# Patient Record
Sex: Female | Born: 1975
Health system: Southern US, Community
[De-identification: ages and names within clinical notes are randomized; demographics above are authoritative.]

## PROBLEM LIST (undated history)

## (undated) DIAGNOSIS — R002 Palpitations: Secondary | ICD-10-CM

## (undated) DIAGNOSIS — R000 Tachycardia, unspecified: Secondary | ICD-10-CM

## (undated) DIAGNOSIS — O00109 Unspecified tubal pregnancy without intrauterine pregnancy: Secondary | ICD-10-CM

## (undated) HISTORY — PX: ECTOPIC PREGNANCY SURGERY: SHX613

## (undated) HISTORY — PX: FOOT SURGERY: SHX648

## (undated) HISTORY — DX: Palpitations: R00.2

## (undated) HISTORY — DX: Unspecified tubal pregnancy without intrauterine pregnancy: O00.109

---

## 2002-02-04 ENCOUNTER — Emergency Department (HOSPITAL_COMMUNITY): Admission: EM | Admit: 2002-02-04 | Discharge: 2002-02-04 | Payer: Self-pay

## 2002-03-09 ENCOUNTER — Emergency Department (HOSPITAL_COMMUNITY): Admission: EM | Admit: 2002-03-09 | Discharge: 2002-03-09 | Payer: Self-pay | Admitting: Emergency Medicine

## 2002-04-21 ENCOUNTER — Emergency Department (HOSPITAL_COMMUNITY): Admission: EM | Admit: 2002-04-21 | Discharge: 2002-04-21 | Payer: Self-pay | Admitting: Emergency Medicine

## 2002-04-22 ENCOUNTER — Emergency Department (HOSPITAL_COMMUNITY): Admission: EM | Admit: 2002-04-22 | Discharge: 2002-04-22 | Payer: Self-pay | Admitting: Emergency Medicine

## 2002-07-23 ENCOUNTER — Emergency Department (HOSPITAL_COMMUNITY): Admission: EM | Admit: 2002-07-23 | Discharge: 2002-07-23 | Payer: Self-pay | Admitting: Emergency Medicine

## 2002-11-11 ENCOUNTER — Emergency Department (HOSPITAL_COMMUNITY): Admission: EM | Admit: 2002-11-11 | Discharge: 2002-11-11 | Payer: Self-pay | Admitting: Emergency Medicine

## 2002-11-13 ENCOUNTER — Ambulatory Visit (HOSPITAL_COMMUNITY): Admission: RE | Admit: 2002-11-13 | Discharge: 2002-11-13 | Payer: Self-pay | Admitting: Emergency Medicine

## 2002-11-13 ENCOUNTER — Encounter: Payer: Self-pay | Admitting: Emergency Medicine

## 2003-10-02 ENCOUNTER — Emergency Department (HOSPITAL_COMMUNITY): Admission: EM | Admit: 2003-10-02 | Discharge: 2003-10-02 | Payer: Self-pay | Admitting: Emergency Medicine

## 2004-02-10 ENCOUNTER — Other Ambulatory Visit: Admission: RE | Admit: 2004-02-10 | Discharge: 2004-02-10 | Payer: Self-pay | Admitting: Obstetrics and Gynecology

## 2005-03-12 ENCOUNTER — Emergency Department (HOSPITAL_COMMUNITY): Admission: EM | Admit: 2005-03-12 | Discharge: 2005-03-13 | Payer: Self-pay | Admitting: Emergency Medicine

## 2005-11-13 ENCOUNTER — Emergency Department (HOSPITAL_COMMUNITY): Admission: EM | Admit: 2005-11-13 | Discharge: 2005-11-14 | Payer: Self-pay | Admitting: Emergency Medicine

## 2008-07-07 ENCOUNTER — Emergency Department (HOSPITAL_BASED_OUTPATIENT_CLINIC_OR_DEPARTMENT_OTHER): Admission: EM | Admit: 2008-07-07 | Discharge: 2008-07-07 | Payer: Self-pay | Admitting: Emergency Medicine

## 2010-05-20 ENCOUNTER — Emergency Department (HOSPITAL_BASED_OUTPATIENT_CLINIC_OR_DEPARTMENT_OTHER): Admission: EM | Admit: 2010-05-20 | Discharge: 2010-05-20 | Payer: Self-pay | Admitting: Emergency Medicine

## 2010-08-05 ENCOUNTER — Emergency Department (HOSPITAL_BASED_OUTPATIENT_CLINIC_OR_DEPARTMENT_OTHER): Admission: EM | Admit: 2010-08-05 | Discharge: 2010-08-05 | Payer: Self-pay | Admitting: Emergency Medicine

## 2011-06-11 ENCOUNTER — Encounter: Payer: Self-pay | Admitting: Emergency Medicine

## 2011-06-11 ENCOUNTER — Emergency Department (INDEPENDENT_AMBULATORY_CARE_PROVIDER_SITE_OTHER): Payer: Self-pay

## 2011-06-11 ENCOUNTER — Emergency Department (HOSPITAL_BASED_OUTPATIENT_CLINIC_OR_DEPARTMENT_OTHER)
Admission: EM | Admit: 2011-06-11 | Discharge: 2011-06-11 | Disposition: A | Discharge status: Home / Self Care | Payer: Self-pay | Attending: Emergency Medicine | Admitting: Emergency Medicine

## 2011-06-11 DIAGNOSIS — S61209A Unspecified open wound of unspecified finger without damage to nail, initial encounter: Secondary | ICD-10-CM | POA: Insufficient documentation

## 2011-06-11 DIAGNOSIS — S61409A Unspecified open wound of unspecified hand, initial encounter: Secondary | ICD-10-CM

## 2011-06-11 DIAGNOSIS — W268XXA Contact with other sharp object(s), not elsewhere classified, initial encounter: Secondary | ICD-10-CM

## 2011-06-11 DIAGNOSIS — Y92009 Unspecified place in unspecified non-institutional (private) residence as the place of occurrence of the external cause: Secondary | ICD-10-CM | POA: Insufficient documentation

## 2011-06-11 DIAGNOSIS — X838XXA Intentional self-harm by other specified means, initial encounter: Secondary | ICD-10-CM | POA: Insufficient documentation

## 2011-06-11 DIAGNOSIS — S61219A Laceration without foreign body of unspecified finger without damage to nail, initial encounter: Secondary | ICD-10-CM

## 2011-06-11 MED ORDER — HYDROCODONE-ACETAMINOPHEN 5-325 MG PO TABS: 2.0000 | ORAL_TABLET | ORAL | Status: AC | PRN

## 2011-06-11 MED ORDER — LIDOCAINE HCL 2 % IJ SOLN
INTRAMUSCULAR | Status: DC
Start: 2011-06-11 — End: 2011-06-11
  Filled 2011-06-11: qty 1

## 2011-06-11 NOTE — ED Notes (Addendum)
Suture cart placed in the room. Wet gauze has been placed on the patient's right hand.

## 2011-06-11 NOTE — ED Notes (Signed)
Pt c/o RT hand pain s/p punching a bathroom mirror this am; multiple jagged lacerations present, minimal active bleeding

## 2011-06-11 NOTE — ED Provider Notes (Signed)
History/physical exam/procedure(s) were performed by non-physician practitioner and as supervising physician I was immediately available for consultation/collaboration. I have reviewed all notes and am in agreement with care and plan.   Hilario Quarry, MD 06/11/11 1630

## 2011-06-11 NOTE — ED Provider Notes (Signed)
History     CSN: [id number] Arrival date & time: 06/11/2011 11:56 AM   Chief Complaint  Patient presents with  . Hand Injury     (Include location/radiation/quality/duration/timing/severity/associated sxs/prior treatment) Patient is a 35 y.o. female presenting with hand injury.  Hand Injury  The incident occurred 3 to 5 hours ago. The incident occurred at home. There was no injury mechanism. The pain is present in the right hand. The quality of the pain is described as aching. The pain is at a severity of 5/10. The pain is moderate. The pain has been constant since the incident. Pertinent negatives include no fever. Possible foreign bodies include glass. The symptoms are aggravated by movement. She has tried nothing for the symptoms. The treatment provided moderate relief.  Pt hit a mirror with right hand.  Pt complains of lacerrations to hand.   History reviewed. No pertinent past medical history.   History reviewed. No pertinent past surgical history.  No family history on file.  History  Substance Use Topics  . Smoking status: Never Smoker   . Smokeless tobacco: Not on file  . Alcohol Use: Yes     occ    OB History    Grav Para Term Preterm Abortions TAB SAB Ect Mult Living                  Review of Systems  Constitutional: Negative for fever.  Skin: Positive for wound.  All other systems reviewed and are negative.    Allergies  Review of patient's allergies indicates no known allergies.  Home Medications  No current outpatient prescriptions on file.  Physical Exam    BP 132/80  Pulse 63  Temp(Src) 98.2 F (36.8 C) (Oral)  Resp 18  SpO2 100%  LMP 06/06/2011  Physical Exam  Nursing note and vitals reviewed. Constitutional: She appears well-developed.  HENT:  Head: Normocephalic.  Musculoskeletal: She exhibits tenderness.       Laceration right hand, base of 5th finger,  From,  nv and ns intact.  Abrasions to 2nd and 3rd finger,  Abrasion mid  hand,  1cm gapping area   Neurological: She is alert.  Skin: Skin is warm and dry. There is erythema.  Psychiatric: She has a normal mood and affect.    ED Course  LACERATION REPAIR Date/Time: 06/11/2011 2:37 PM Performed by: Langston Masker Authorized by: Hilario Quarry Consent: Verbal consent obtained. Consent given by: patient Patient understanding: patient states understanding of the procedure being performed Patient identity confirmed: verbally with patient Time out: Immediately prior to procedure a "time out" was called to verify the correct patient, procedure, equipment, support staff and site/side marked as required. Body area: upper extremity Location details: right ring finger Laceration length: 1 cm Foreign bodies: glass Tendon involvement: none Nerve involvement: none Vascular damage: no Anesthesia: local infiltration Patient sedated: no Irrigation solution: saline Skin closure: 5-0 Prolene Number of sutures: 3 Technique: simple Approximation: close Approximation difficulty: simple Dressing: 4x4 sterile gauze Patient tolerance: Patient tolerated the procedure well with no immediate complications.    No results found for this or any previous visit. Dg Hand Complete Right  06/11/2011  *RADIOLOGY REPORT*  Clinical Data: Punched a mirror with right hand.  Lacerations.  RIGHT HAND - COMPLETE 3+ VIEW  Comparison: None  Findings: No evidence for acute fracture or subluxation.  There is soft tissue defect at the web space between the fourth and fifth digits.  No evidence for radiopaque foreign body.  IMPRESSION:  1. Visualized osseous structures have a normal appearance. 2.  No radiopaque foreign body. 3.  Laceration.  Original Report Authenticated By: Patterson Hammersmith, M.D.     No diagnosis found.   MDM Pt advised suture removal in 8 days. No results found for this or any previous visit. Dg Hand Complete Right  06/11/2011  *RADIOLOGY REPORT*  Clinical Data:  Punched a mirror with right hand.  Lacerations.  RIGHT HAND - COMPLETE 3+ VIEW  Comparison: None  Findings: No evidence for acute fracture or subluxation.  There is soft tissue defect at the web space between the fourth and fifth digits.  No evidence for radiopaque foreign body.  IMPRESSION:  1. Visualized osseous structures have a normal appearance. 2.  No radiopaque foreign body. 3.  Laceration.  Original Report Authenticated By: Patterson Hammersmith, M.D.         Langston Masker, [place] 06/11/11 1439

## 2011-06-28 LAB — RPR: RPR Ser Ql: NONREACTIVE

## 2011-06-28 LAB — URINE MICROSCOPIC-ADD ON

## 2011-06-28 LAB — DIFFERENTIAL
Basophils Absolute: 0
Basophils Relative: 0
Lymphocytes Relative: 37
Monocytes Absolute: 0.4
Neutro Abs: 3.1
Neutrophils Relative %: 55

## 2011-06-28 LAB — GC/CHLAMYDIA PROBE AMP, GENITAL: GC Probe Amp, Genital: NEGATIVE

## 2011-06-28 LAB — URINALYSIS, ROUTINE W REFLEX MICROSCOPIC
Glucose, UA: NEGATIVE
Ketones, ur: NEGATIVE
Leukocytes, UA: NEGATIVE
Nitrite: NEGATIVE
Specific Gravity, Urine: 1.026
pH: 5.5

## 2011-06-28 LAB — CBC
MCHC: 32.8
Platelets: 185
RDW: 12.5

## 2011-06-28 LAB — WET PREP, GENITAL: Yeast Wet Prep HPF POC: NONE SEEN

## 2011-06-28 LAB — PREGNANCY, URINE: Preg Test, Ur: NEGATIVE

## 2012-06-04 ENCOUNTER — Encounter (HOSPITAL_BASED_OUTPATIENT_CLINIC_OR_DEPARTMENT_OTHER): Payer: Self-pay | Admitting: *Deleted

## 2012-06-04 ENCOUNTER — Emergency Department (HOSPITAL_BASED_OUTPATIENT_CLINIC_OR_DEPARTMENT_OTHER)
Admission: EM | Admit: 2012-06-04 | Discharge: 2012-06-04 | Disposition: A | Discharge status: Home / Self Care | Payer: Self-pay | Attending: Emergency Medicine | Admitting: Emergency Medicine

## 2012-06-04 DIAGNOSIS — R002 Palpitations: Secondary | ICD-10-CM | POA: Insufficient documentation

## 2012-06-04 LAB — CBC WITH DIFFERENTIAL/PLATELET
Basophils Relative: 0 % (ref 0–1)
Eosinophils Relative: 1 % (ref 0–5)
HCT: 33.6 % — ABNORMAL LOW (ref 36.0–46.0)
Hemoglobin: 11.4 g/dL — ABNORMAL LOW (ref 12.0–15.0)
Lymphocytes Relative: 47 % — ABNORMAL HIGH (ref 12–46)
MCHC: 33.9 g/dL (ref 30.0–36.0)
MCV: 85.9 fL (ref 78.0–100.0)
Monocytes Absolute: 0.3 10*3/uL (ref 0.1–1.0)
Monocytes Relative: 9 % (ref 3–12)
Neutro Abs: 1.7 10*3/uL (ref 1.7–7.7)

## 2012-06-04 LAB — BASIC METABOLIC PANEL
BUN: 15 mg/dL (ref 6–23)
CO2: 29 mEq/L (ref 19–32)
Calcium: 9.5 mg/dL (ref 8.4–10.5)
Chloride: 102 mEq/L (ref 96–112)
Creatinine, Ser: 1 mg/dL (ref 0.50–1.10)

## 2012-06-04 NOTE — ED Provider Notes (Signed)
History     CSN: [id number]  Arrival date & time 06/04/12  1356   First MD Initiated Contact with Patient 06/04/12 1432      Chief Complaint  Patient presents with  . Palpitations    (Consider location/radiation/quality/duration/timing/severity/associated sxs/prior treatment) HPI  Patient complaining of palpitations like her heart is racing for 2 months. She states that she has had some increase in her exercise. She denies any chest pain or shortness of breath. She has not had any change in weight, sleep, or eating pattern. She has no history of coronary artery disease or problems with her thyroid.  History reviewed. No pertinent past medical history.  History reviewed. No pertinent past surgical history.  History reviewed. No pertinent family history.  History  Substance Use Topics  . Smoking status: Never Smoker   . Smokeless tobacco: Not on file  . Alcohol Use: No     occ    OB History    Grav Para Term Preterm Abortions TAB SAB Ect Mult Living                  Review of Systems  Constitutional: Negative for fever, chills, activity change, appetite change and unexpected weight change.  HENT: Negative for sore throat, rhinorrhea, neck pain, neck stiffness and sinus pressure.   Eyes: Negative for visual disturbance.  Respiratory: Negative for cough and shortness of breath.   Cardiovascular: Negative for chest pain and leg swelling.  Gastrointestinal: Negative for vomiting, abdominal pain, diarrhea and blood in stool.  Genitourinary: Negative for dysuria, urgency, frequency, vaginal discharge and difficulty urinating.  Musculoskeletal: Negative for myalgias, arthralgias and gait problem.  Skin: Negative for color change and rash.  Neurological: Negative for weakness, light-headedness and headaches.  Hematological: Does not bruise/bleed easily.  Psychiatric/Behavioral: Negative for dysphoric mood.    Allergies  Review of patient's allergies indicates no known  allergies.  Home Medications  No current outpatient prescriptions on file.  BP 116/67  Pulse 61  Temp 98.5 F (36.9 C)  Resp 16  Ht 5\' 4"  (1.626 m)  Wt 151 lb (68.493 kg)  BMI 25.92 kg/m2  SpO2 100%  LMP 05/11/2012  Physical Exam  Nursing note and vitals reviewed. Constitutional: She appears well-developed and well-nourished.  HENT:  Head: Normocephalic and atraumatic.  Eyes: Conjunctivae and EOM are normal. Pupils are equal, round, and reactive to light.  Neck: Normal range of motion. Neck supple.  Cardiovascular: Normal rate, regular rhythm, normal heart sounds and intact distal pulses.   Pulmonary/Chest: Effort normal and breath sounds normal.  Abdominal: Soft. Bowel sounds are normal.  Musculoskeletal: Normal range of motion.  Neurological: She is alert.  Skin: Skin is warm and dry.  Psychiatric: She has a normal mood and affect. Thought content normal.    ED Course  Procedures (including critical care time)  Labs Reviewed  CBC WITH DIFFERENTIAL - Abnormal; Notable for the following:    WBC 3.9 (*)     Hemoglobin 11.4 (*)     HCT 33.6 (*)     Lymphocytes Relative 47 (*)     All other components within normal limits  BASIC METABOLIC PANEL   No results found.   No diagnosis found.   Date: 06/04/2012  Rate: 59  Rhythm: normal sinus rhythm  QRS Axis: normal  Intervals: normal  ST/T Wave abnormalities: early repolarization  Conduction Disutrbances:none  Narrative Interpretation:   Old EKG Reviewed: unchanged   . MDM  Patient with ekg unchanged from prior .  Patient advised close follow up with pmd for thyroid studies and close follow up.     I personally performed the services described in this documentation, which was scribed in my presence. The recorded information has been reviewed and considered.   Hilario Quarry, MD 06/06/12 [phone]

## 2012-06-04 NOTE — ED Notes (Signed)
Pt c/o "heart racing " x 1 week on and off

## 2012-07-14 ENCOUNTER — Emergency Department (HOSPITAL_BASED_OUTPATIENT_CLINIC_OR_DEPARTMENT_OTHER)
Admission: EM | Admit: 2012-07-14 | Discharge: 2012-07-14 | Disposition: A | Discharge status: Home / Self Care | Payer: Self-pay | Attending: Emergency Medicine | Admitting: Emergency Medicine

## 2012-07-14 ENCOUNTER — Emergency Department (HOSPITAL_BASED_OUTPATIENT_CLINIC_OR_DEPARTMENT_OTHER): Payer: Self-pay

## 2012-07-14 ENCOUNTER — Encounter (HOSPITAL_BASED_OUTPATIENT_CLINIC_OR_DEPARTMENT_OTHER): Payer: Self-pay | Admitting: *Deleted

## 2012-07-14 DIAGNOSIS — X58XXXA Exposure to other specified factors, initial encounter: Secondary | ICD-10-CM | POA: Insufficient documentation

## 2012-07-14 DIAGNOSIS — S93609A Unspecified sprain of unspecified foot, initial encounter: Secondary | ICD-10-CM

## 2012-07-14 DIAGNOSIS — M722 Plantar fascial fibromatosis: Secondary | ICD-10-CM

## 2012-07-14 DIAGNOSIS — S93699A Other sprain of unspecified foot, initial encounter: Secondary | ICD-10-CM | POA: Insufficient documentation

## 2012-07-14 HISTORY — DX: Tachycardia, unspecified: R00.0

## 2012-07-14 MED ORDER — IBUPROFEN 800 MG PO TABS: 800.0000 mg | ORAL_TABLET | Freq: Three times a day (TID) | ORAL | Status: DC

## 2012-07-14 MED ORDER — HYDROCODONE-ACETAMINOPHEN 5-325 MG PO TABS: ORAL_TABLET | ORAL | Status: DC

## 2012-07-14 NOTE — ED Notes (Signed)
Patient c/o L foot pain for the past two weeks, hurts to ambulate, when elevated pain not as bad, but continues to throb. Took OTC meds but no relief, no injury. Walks on treadmill to exercise

## 2012-07-14 NOTE — Discharge Instructions (Signed)
 Plantar Fasciitis Plantar fasciitis is a common condition that causes foot pain. It is soreness (inflammation) of the band of tough fibrous tissue on the bottom of the foot that runs from the heel bone (calcaneus) to the ball of the foot. The cause of this soreness may be from excessive standing, poor fitting shoes, running on hard surfaces, being overweight, having an abnormal walk, or overuse (this is common in runners) of the painful foot or feet. It is also common in aerobic exercise dancers and ballet dancers. SYMPTOMS  Most people with plantar fasciitis complain of:  Severe pain in the morning on the bottom of their foot especially when taking the first steps out of bed. This pain recedes after a few minutes of walking.  Severe pain is experienced also during walking following a long period of inactivity.  Pain is worse when walking barefoot or up stairs DIAGNOSIS   Your caregiver will diagnose this condition by examining and feeling your foot.  Special tests such as X-rays of your foot, are usually not needed. PREVENTION   Consult a sports medicine professional before beginning a new exercise program.  Walking programs offer a good workout. With walking there is a lower chance of overuse injuries common to runners. There is less impact and less jarring of the joints.  Begin all new exercise programs slowly. If problems or pain develop, decrease the amount of time or distance until you are at a comfortable level.  Wear good shoes and replace them regularly.  Stretch your foot and the heel cords at the back of the ankle (Achilles tendon) both before and after exercise.  Run or exercise on even surfaces that are not hard. For example, asphalt is better than pavement.  Do not run barefoot on hard surfaces.  If using a treadmill, vary the incline.  Do not continue to workout if you have foot or joint problems. Seek professional help if they do not improve. HOME CARE INSTRUCTIONS     Avoid activities that cause you pain until you recover.  Use ice or cold packs on the problem or painful areas after working out.  Only take over-the-counter or prescription medicines for pain, discomfort, or fever as directed by your caregiver.  Soft shoe inserts or athletic shoes with air or gel sole cushions may be helpful.  If problems continue or become more severe, consult a sports medicine caregiver or your own health care provider. Cortisone is a potent anti-inflammatory medication that may be injected into the painful area. You can discuss this treatment with your caregiver. MAKE SURE YOU:   Understand these instructions.  Will watch your condition.  Will get help right away if you are not doing well or get worse. Document Released: 06/07/2001 Document Revised: 12/05/2011 Document Reviewed: 08/06/2008 [facility] Patient Information 2013 [place], [place].    Foot Sprain The muscles and cord like structures which attach muscle to bone (tendons) that surround the feet are made up of units. A foot sprain can occur at the weakest spot in any of these units. This condition is most often caused by injury to or overuse of the foot, as from playing contact sports, or aggravating a previous injury, or from poor conditioning, or obesity. SYMPTOMS  Pain with movement of the foot.  Tenderness and swelling at the injury site.  Loss of strength is present in moderate or severe sprains. THE THREE GRADES OR SEVERITY OF FOOT SPRAIN ARE:  Mild (Grade I): Slightly pulled muscle without tearing of muscle or  tendon fibers or loss of strength.  Moderate (Grade II): Tearing of fibers in a muscle, tendon, or at the attachment to bone, with small decrease in strength.  Severe (Grade III): Rupture of the muscle-tendon-bone attachment, with separation of fibers. Severe sprain requires surgical repair. Often repeating (chronic) sprains are caused by overuse. Sudden (acute) sprains are caused by  direct injury or over-use. DIAGNOSIS  Diagnosis of this condition is usually by your own observation. If problems continue, a caregiver may be required for further evaluation and treatment. X-rays may be required to make sure there are not breaks in the bones (fractures) present. Continued problems may require physical therapy for treatment. PREVENTION  Use strength and conditioning exercises appropriate for your sport.  Warm up properly prior to working out.  Use athletic shoes that are made for the sport you are participating in.  Allow adequate time for healing. Early return to activities makes repeat injury more likely, and can lead to an unstable arthritic foot that can result in prolonged disability. Mild sprains generally heal in 3 to 10 days, with moderate and severe sprains taking 2 to 10 weeks. Your caregiver can help you determine the proper time required for healing. HOME CARE INSTRUCTIONS   Apply ice to the injury for 15 to 20 minutes, 3 to 4 times per day. Put the ice in a plastic bag and place a towel between the bag of ice and your skin.  An elastic wrap (like an Ace bandage) may be used to keep swelling down.  Keep foot above the level of the heart, or at least raised on a footstool, when swelling and pain are present.  Try to avoid use other than gentle range of motion while the foot is painful. Do not resume use until instructed by your caregiver. Then begin use gradually, not increasing use to the point of pain. If pain does develop, decrease use and continue the above measures, gradually increasing activities that do not cause discomfort, until you gradually achieve normal use.  Use crutches if and as instructed, and for the length of time instructed.  Keep injured foot and ankle wrapped between treatments.  Massage foot and ankle for comfort and to keep swelling down. Massage from the toes up towards the knee.  Only take over-the-counter or prescription medicines for  pain, discomfort, or fever as directed by your caregiver. SEEK IMMEDIATE MEDICAL CARE IF:   Your pain and swelling increase, or pain is not controlled with medications.  You have loss of feeling in your foot or your foot turns cold or blue.  You develop new, unexplained symptoms, or an increase of the symptoms that brought you to your caregiver. MAKE SURE YOU:   Understand these instructions.  Will watch your condition.  Will get help right away if you are not doing well or get worse. Document Released: 03/04/2002 Document Revised: 12/05/2011 Document Reviewed: 05/01/2008 [facility] Patient Information 2013 [place], [place].

## 2012-07-14 NOTE — ED Provider Notes (Addendum)
History     CSN: [id number]  Arrival date & time 07/14/12  2130   First MD Initiated Contact with Patient 07/14/12 [phone]      Chief Complaint  Patient presents with  . Foot Pain    (Consider location/radiation/quality/duration/timing/severity/associated sxs/prior treatment) HPI  Past Medical History  Diagnosis Date  . Racing heart beat     avoids caffine/chocolate    Past Surgical History  Procedure Date  . Foot surgery      l foot     History reviewed. No pertinent family history.  History  Substance Use Topics  . Smoking status: Never Smoker   . Smokeless tobacco: Not on file  . Alcohol Use: Yes    OB History    Grav Para Term Preterm Abortions TAB SAB Ect Mult Living                  Review of Systems  Constitutional: Negative for fever and chills.  Musculoskeletal: Positive for arthralgias. Negative for joint swelling.  Skin: Negative for color change, rash and wound.  Neurological: Positive for dizziness. Negative for seizures, speech difficulty, weakness, light-headedness, numbness and headaches.    Allergies  Vicodin  Home Medications   Current Outpatient Rx  Name Route Sig Dispense Refill  . VITAMIN B12 PO Oral Take by mouth.    . MULTI-VITAMIN/MINERALS PO TABS Oral Take 1 tablet by mouth daily.    Marland Kitchen HYDROCODONE-ACETAMINOPHEN 5-325 MG PO TABS  1-2 tablets po q 6 hours prn moderate to severe pain 20 tablet 0  . IBUPROFEN 800 MG PO TABS Oral Take 1 tablet (800 mg total) by mouth 3 (three) times daily. 21 tablet 0    BP 100/47  Pulse 54  Temp 99.1 F (37.3 C) (Oral)  Resp 18  SpO2 100%  LMP 07/14/2012  Physical Exam  Nursing note and vitals reviewed. Constitutional: She appears well-developed and well-nourished.  HENT:  Head: Normocephalic and atraumatic.  Cardiovascular: Normal rate.   Pulmonary/Chest: Effort normal. No respiratory distress.  Musculoskeletal:       Left foot: She exhibits tenderness. She exhibits normal range of  motion, no swelling, normal capillary refill, no deformity and no laceration.       Tender to sole of foot.  No sig change with toe flexion or extension.  No rash, discoloration.  No obv deformity to foot. On left.   Neurological: She is alert. She has normal strength. No sensory deficit. She exhibits normal muscle tone. GCS eye subscore is 4. GCS verbal subscore is 5. GCS motor subscore is 6.    ED Course  Procedures (including critical care time)  Labs Reviewed - No data to display Dg Foot 2 Views Left  07/14/2012  *RADIOLOGY REPORT*  Clinical Data:  LEFT FOOT - 2 VIEW  Comparison: 07/14/2012  Findings: A frontal projection of the foot with weightbearing was performed.  A lateral view of the foot with weightbearing is not provided.  No malalignment at the Lisfranc joint on this frontal projection. No fracture observed.  Possible proximal interphalangeal joint fusion of the second, third, and fourth digit phalanges.  IMPRESSION:  1.  Frontal projection of the foot with weightbearing demonstrates no Lisfranc joint instability.  No acute bony findings.  If symptoms persist despite conservative therapy, MRI followup may be warranted.   Original Report Authenticated By: Dellia Cloud, M.D.    Dg Foot Complete Left  07/14/2012  *RADIOLOGY REPORT*  Clinical Data:  left foot pain  LEFT  FOOT - COMPLETE 3+ VIEW  Comparison: None.  Findings: Three views of the left foot submitted.  No acute fracture or subluxation.  No radiopaque foreign body.  There is bony fusion proximal with mid phalanx second and third toe.  IMPRESSION: No acute fracture or subluxation.   Original Report Authenticated By: Natasha Mead, M.D.    I reviewed films myself and agree with radiologist interpretation  1. Foot sprain   2. Plantar fasciitis     1:46 PM I inquired with radiology staff, will want a standing foot film as well.    MDM  Pt likely with foot sprain.  Will get standing foot film to r/o lisfranc's injury.   Also consider plantar fasciitis.          Gavin Pound. Oletta Lamas, MD 07/14/12 2147  Gavin Pound. Oletta Lamas, MD 07/14/12 2150

## 2012-08-20 ENCOUNTER — Ambulatory Visit (INDEPENDENT_AMBULATORY_CARE_PROVIDER_SITE_OTHER): Payer: No Typology Code available for payment source | Admitting: Cardiology

## 2012-08-20 ENCOUNTER — Encounter: Payer: Self-pay | Admitting: Cardiology

## 2012-08-20 VITALS — BP 120/80 | HR 69 | Ht 64.0 in | Wt 152.0 lb

## 2012-08-20 DIAGNOSIS — R002 Palpitations: Secondary | ICD-10-CM

## 2012-08-20 NOTE — Progress Notes (Signed)
HPI The patient presents for evaluation of palpitations. She has been having this for several weeks. She said she feels her heart skipping. She will feel a skipped beat followed by a strong beat. She might get a tingling or sweaty sensation. On one occasion this happened and she felt presyncopal. She presented to the hospital in [place].  I was able to review these records. She did have premature ectopic complexes with a right bundle branch pattern. She had a normal TSH. She had a slightly elevated CK but normal MB and troponin. Her potassium was slightly low. She says she cut out caffeine following this and had some improvement in symptoms. However, she recently took a supplement med for exercise. She thought that it was without caffeine. Her symptoms worsen. She says she feels the skipping heartbeats when she is resting or slowing down from exercise. She cannot bring it on with exercise. She might get some mild chest tightness. She denies any overt shortness of breath, PND or orthopnea. She's had no weight gain or edema. She has been quite anxious about this. She does exercise routinely. This has been going well.  No Known Allergies  Current Outpatient Prescriptions  Medication Sig Dispense Refill  . Ascorbic Acid (VITAMIN C) 500 MG CHEW Chew by mouth.      . Prenatal Vit-Fe Fumarate-FA (PRENATAL VITAMINS PLUS) 27-1 MG TABS Take by mouth.      Marland Kitchen VITAMIN B1-B12 PO Take 500 Units by mouth.        Past Medical History  Diagnosis Date  . Ectopic pregnancy, tubal   . Palpitation     Past Surgical History  Procedure Date  . Foot surgery     Left  . Ectopic pregnancy surgery     2    Family History  Problem Relation Age of Onset  . CAD Father 30  . Heart disease Mother 69    Palpitations and murmur    History   Social History  . Marital Status: Single    Spouse Name: N/A    Number of Children: 0  . Years of Education: N/A   Occupational History  . Student and works in  childcare    Social History Main Topics  . Smoking status: Former Smoker    Types: Cigarettes  . Smokeless tobacco: Not on file     Comment: Quit tobacco 04  . Alcohol Use: No     Comment: occ  . Drug Use: No  . Sexually Active: Yes    Birth Control/ Protection: None   Other Topics Concern  . Not on file   Social History Narrative   Lives with boyfriend.    Exercises regularly    ROS:  As stated in the HPI and negative for all other systems.  PHYSICAL EXAM BP 120/80  Pulse 69  Ht 5\' 4"  (1.626 m)  Wt 152 lb (68.947 kg)  BMI 26.09 kg/m2  SpO2 98% GENERAL:  Well appearing HEENT:  Pupils equal round and reactive, fundi not visualized, oral mucosa unremarkable NECK:  No jugular venous distention, waveform within normal limits, carotid upstroke brisk and symmetric, no bruits, no thyromegaly LYMPHATICS:  No cervical, inguinal adenopathy LUNGS:  Clear to auscultation bilaterally BACK:  No CVA tenderness CHEST:  Unremarkable HEART:  PMI not displaced or sustained,S1 and S2 within normal limits, no S3, no S4, no clicks, no rubs, no murmurs ABD:  Flat, positive bowel sounds normal in frequency in pitch, no bruits, no rebound, no guarding,  no midline pulsatile mass, no hepatomegaly, no splenomegaly EXT:  2 plus pulses throughout, no edema, no cyanosis no clubbing SKIN:  No rashes no nodules NEURO:  Cranial nerves II through XII grossly intact, motor grossly intact throughout [facility]:  Cognitively intact, oriented to person place and time   EKG:  Normal sinus rhythm, rate 59, early repolarization pattern, no acute ST-T wave changes 08/20/2012  ASSESSMENT AND PLAN  Palpitations - The patient does have demonstrated premature ectopic complexes. These are quite symptomatic to her. I'm going to start with a 48 hour Holter, echocardiogram and an exercise treadmill test. Likely she will need to be treated symptomatically for these following this. We had a long discussion about this. He

## 2012-08-20 NOTE — Patient Instructions (Addendum)
The current medical regimen is effective;  continue present plan and medications.  Your physician has requested that you have an echocardiogram. Echocardiography is a painless test that uses sound waves to create images of your heart. It provides your doctor with information about the size and shape of your heart and how well your heart's chambers and valves are working. This procedure takes approximately one hour. There are no restrictions for this procedure.  Your physician has recommended that you wear a holter monitor. Holter monitors are medical devices that record the heart's electrical activity. Doctors most often use these monitors to diagnose arrhythmias. Arrhythmias are problems with the speed or rhythm of the heartbeat. The monitor is a small, portable device. You can wear one while you do your normal daily activities. This is usually used to diagnose what is causing palpitations/syncope (passing out).  Your physician has requested that you have an exercise tolerance test. For further information please visit [email or web address] Please also follow instruction sheet, as given.  Please sign up for My Chart.  This will allow you to view your records at your convenience.  Follow up will be based on results.

## 2012-08-21 DIAGNOSIS — R002 Palpitations: Secondary | ICD-10-CM | POA: Insufficient documentation

## 2012-08-28 ENCOUNTER — Ambulatory Visit (HOSPITAL_COMMUNITY): Payer: No Typology Code available for payment source | Attending: Cardiovascular Disease | Admitting: Radiology

## 2012-08-28 ENCOUNTER — Encounter (INDEPENDENT_AMBULATORY_CARE_PROVIDER_SITE_OTHER): Payer: No Typology Code available for payment source

## 2012-08-28 DIAGNOSIS — R079 Chest pain, unspecified: Secondary | ICD-10-CM | POA: Insufficient documentation

## 2012-08-28 DIAGNOSIS — R0989 Other specified symptoms and signs involving the circulatory and respiratory systems: Secondary | ICD-10-CM | POA: Insufficient documentation

## 2012-08-28 DIAGNOSIS — Z87891 Personal history of nicotine dependence: Secondary | ICD-10-CM | POA: Insufficient documentation

## 2012-08-28 DIAGNOSIS — R0609 Other forms of dyspnea: Secondary | ICD-10-CM | POA: Insufficient documentation

## 2012-08-28 DIAGNOSIS — Z8249 Family history of ischemic heart disease and other diseases of the circulatory system: Secondary | ICD-10-CM | POA: Insufficient documentation

## 2012-08-28 DIAGNOSIS — R002 Palpitations: Secondary | ICD-10-CM

## 2012-08-28 NOTE — Progress Notes (Signed)
Echocardiogram performed.  

## 2012-08-30 ENCOUNTER — Telehealth: Payer: Self-pay | Admitting: Cardiology

## 2012-08-30 NOTE — Telephone Encounter (Signed)
Spoke with pt, aware of echo results. 

## 2012-08-30 NOTE — Telephone Encounter (Signed)
Pt would like results of her echo

## 2012-09-04 ENCOUNTER — Encounter: Payer: Self-pay | Admitting: Physician Assistant

## 2012-10-03 ENCOUNTER — Encounter: Payer: Self-pay | Admitting: Physician Assistant

## 2013-01-10 ENCOUNTER — Encounter: Payer: Self-pay | Admitting: Physician Assistant

## 2013-08-15 ENCOUNTER — Emergency Department (HOSPITAL_BASED_OUTPATIENT_CLINIC_OR_DEPARTMENT_OTHER)
Admission: EM | Admit: 2013-08-15 | Discharge: 2013-08-15 | Disposition: A | Discharge status: Home / Self Care | Payer: No Typology Code available for payment source | Attending: Emergency Medicine | Admitting: Emergency Medicine

## 2013-08-15 ENCOUNTER — Encounter (HOSPITAL_BASED_OUTPATIENT_CLINIC_OR_DEPARTMENT_OTHER): Payer: Self-pay | Admitting: Emergency Medicine

## 2013-08-15 DIAGNOSIS — Z87891 Personal history of nicotine dependence: Secondary | ICD-10-CM | POA: Insufficient documentation

## 2013-08-15 DIAGNOSIS — N643 Galactorrhea not associated with childbirth: Secondary | ICD-10-CM | POA: Insufficient documentation

## 2013-08-15 DIAGNOSIS — Z8742 Personal history of other diseases of the female genital tract: Secondary | ICD-10-CM | POA: Insufficient documentation

## 2013-08-15 DIAGNOSIS — Z8679 Personal history of other diseases of the circulatory system: Secondary | ICD-10-CM | POA: Insufficient documentation

## 2013-08-15 DIAGNOSIS — Z79899 Other long term (current) drug therapy: Secondary | ICD-10-CM | POA: Insufficient documentation

## 2013-08-15 NOTE — ED Notes (Signed)
Pt. Reports she has been pregnant x two with ectopic pregnancies.   Pt. Reports she is married and sexually active.  Pt. Reports she does not use BC.

## 2013-08-15 NOTE — ED Notes (Signed)
Pt c/o left breast leaking. Pt reports h/o same within previous 4 months. Pt denies pregnancy. Pt denies pain.

## 2013-08-15 NOTE — ED Provider Notes (Signed)
CSN: [id number]     Arrival date & time 08/15/13  1440 History   First MD Initiated Contact with Patient 08/15/13 1447     Chief Complaint  Patient presents with  . Breast Discharge   (Consider location/radiation/quality/duration/timing/severity/associated sxs/prior Treatment) HPI Comments: Pt states that she noticed that her right breast was leaking this morning:pt states that similar symptoms happened about 3 months ago:was not seen:pt denies any medical problems:denies pain  The history is provided by the patient. No language interpreter was used.    Past Medical History  Diagnosis Date  . Ectopic pregnancy, tubal   . Palpitation    Past Surgical History  Procedure Laterality Date  . Foot surgery      Left  . Ectopic pregnancy surgery      2   Family History  Problem Relation Age of Onset  . CAD Father 48  . Heart disease Mother 49    Palpitations and murmur   History  Substance Use Topics  . Smoking status: Former Smoker    Types: Cigarettes  . Smokeless tobacco: Not on file     Comment: Quit tobacco 04  . Alcohol Use: Yes     Comment: occ   OB History   Grav Para Term Preterm Abortions TAB SAB Ect Mult Living                 Review of Systems  Constitutional: Negative.   Respiratory: Negative.   Cardiovascular: Negative.     Allergies  Review of patient's allergies indicates no known allergies.  Home Medications   Current Outpatient Rx  Name  Route  Sig  Dispense  Refill  . Ascorbic Acid (VITAMIN C) 500 MG CHEW   Oral   Chew by mouth.         . Prenatal Vit-Fe Fumarate-FA (PRENATAL VITAMINS PLUS) 27-1 MG TABS   Oral   Take by mouth.         Marland Kitchen VITAMIN B1-B12 PO   Oral   Take 500 Units by mouth.          BP 115/78  Pulse 57  Temp(Src) 98.3 F (36.8 C) (Oral)  Resp 18  Ht 5' 4.5" (1.638 m)  Wt 155 lb (70.308 kg)  BMI 26.20 kg/m2  SpO2 100%  LMP 07/21/2013 Physical Exam  Vitals reviewed. Constitutional: She appears  well-developed and well-nourished.  Cardiovascular: Normal rate and regular rhythm.   Pulmonary/Chest: Effort normal and breath sounds normal.  Genitourinary:  Pt shirt is wet:no discharge or mass noted on exam    ED Course  Procedures (including critical care time) Labs Review Labs Reviewed  PREGNANCY, URINE   Imaging Review No results found.  EKG Interpretation   None       MDM   1. Galactorrhea    Pt scheduled for mammogram and given follow up for work up    Teressa Lower, NP 08/15/13 1547

## 2013-08-15 NOTE — ED Notes (Signed)
Called [facility] and they will call patient with an appointment once pt is discharged from this dept. today.

## 2013-08-21 ENCOUNTER — Other Ambulatory Visit (HOSPITAL_BASED_OUTPATIENT_CLINIC_OR_DEPARTMENT_OTHER): Payer: Self-pay | Admitting: Nurse Practitioner

## 2013-08-24 NOTE — ED Provider Notes (Signed)
Medical screening examination/treatment/procedure(s) were performed by non-physician practitioner and as supervising physician I was immediately available for consultation/collaboration.      Nelia Shi, MD 08/24/13 [phone]

## 2014-04-22 ENCOUNTER — Emergency Department (HOSPITAL_BASED_OUTPATIENT_CLINIC_OR_DEPARTMENT_OTHER)
Admission: EM | Admit: 2014-04-22 | Discharge: 2014-04-22 | Disposition: A | Discharge status: Home / Self Care | Payer: No Typology Code available for payment source | Attending: Emergency Medicine | Admitting: Emergency Medicine

## 2014-04-22 ENCOUNTER — Encounter (HOSPITAL_BASED_OUTPATIENT_CLINIC_OR_DEPARTMENT_OTHER): Payer: Self-pay | Admitting: Emergency Medicine

## 2014-04-22 DIAGNOSIS — Y929 Unspecified place or not applicable: Secondary | ICD-10-CM | POA: Insufficient documentation

## 2014-04-22 DIAGNOSIS — Y93B9 Activity, other involving muscle strengthening exercises: Secondary | ICD-10-CM | POA: Insufficient documentation

## 2014-04-22 DIAGNOSIS — M25569 Pain in unspecified knee: Secondary | ICD-10-CM | POA: Insufficient documentation

## 2014-04-22 DIAGNOSIS — Z87891 Personal history of nicotine dependence: Secondary | ICD-10-CM | POA: Insufficient documentation

## 2014-04-22 DIAGNOSIS — X58XXXA Exposure to other specified factors, initial encounter: Secondary | ICD-10-CM | POA: Insufficient documentation

## 2014-04-22 DIAGNOSIS — S8000XA Contusion of unspecified knee, initial encounter: Secondary | ICD-10-CM | POA: Insufficient documentation

## 2014-04-22 DIAGNOSIS — R58 Hemorrhage, not elsewhere classified: Secondary | ICD-10-CM

## 2014-04-22 NOTE — ED Provider Notes (Signed)
CSN: [id number]     Arrival date & time 04/22/14  2101 History  This chart was scribed for Junius ArgyleForrest S Qiana Landgrebe, MD by Julian HyMorgan Graham, ED Scribe. The patient was seen in [facility]. The patient's care was started at 10:03 PM.     Chief Complaint  Patient presents with  . Knee Pain   Patient is a 38 y.o. female presenting with knee pain. The history is provided by the patient. No language interpreter was used.  Knee Pain Location:  Knee Time since incident:  10 hours Knee location:  R knee Pain details:    Quality: stinging.   Radiates to:  Does not radiate   Severity:  No pain   Onset quality:  Sudden Associated symptoms: no decreased ROM, no fatigue, no fever and no neck pain    HPI Comments: Jannetta QuintJessica Boyce is a 38 y.o. female who presents to the Emergency Department complaining of a new, purple bruise behind her right knee onset 4 hours ago. Pt  Reports associated pain behind the right knee. Pt describes her pain as stinging. Pt states she exercised by doing squats and lunges earlier today. Pt denies injury. Pt reports associated swelling near the site. Pt denies pain with bending the left leg. Pt denies having any other medical issues.  Past Medical History  Diagnosis Date  . Ectopic pregnancy, tubal   . Palpitation    Past Surgical History  Procedure Laterality Date  . Foot surgery      Left  . Ectopic pregnancy surgery      2   Family History  Problem Relation Age of Onset  . CAD Father 7550  . Heart disease Mother 6440    Palpitations and murmur   History  Substance Use Topics  . Smoking status: Former Smoker    Types: Cigarettes  . Smokeless tobacco: Not on file     Comment: Quit tobacco 04  . Alcohol Use: Yes     Comment: occ   OB History   Grav Para Term Preterm Abortions TAB SAB Ect Mult Living                 Review of Systems  Constitutional: Negative for fever and fatigue.  HENT: Negative for congestion and drooling.   Eyes: Negative for pain.   Respiratory: Negative for cough and shortness of breath.   Cardiovascular: Negative for chest pain.  Gastrointestinal: Negative for nausea, vomiting, abdominal pain and diarrhea.  Genitourinary: Negative for dysuria and hematuria.  Musculoskeletal: Negative for neck pain.  Skin: Positive for color change (back of right patellar area).  Neurological: Negative for dizziness and headaches.  Hematological: Negative for adenopathy.  Psychiatric/Behavioral: Negative for behavioral problems.  All other systems reviewed and are negative.     Allergies  Review of patient's allergies indicates no known allergies.  Home Medications   Prior to Admission medications   Medication Sig Start Date End Date Taking? Authorizing Provider  Ascorbic Acid (VITAMIN C) 500 MG CHEW Chew by mouth.    Historical Provider, MD  Prenatal Vit-Fe Fumarate-FA (PRENATAL VITAMINS PLUS) 27-1 MG TABS Take by mouth.    Historical Provider, MD  VITAMIN B1-B12 PO Take 500 Units by mouth.    Historical Provider, MD   Triage Vitals: BP 118/74  Pulse 63  Temp(Src) 98.2 F (36.8 C) (Oral)  Resp 16  Ht 5\' 4"  (1.626 m)  Wt 152 lb (68.947 kg)  BMI 26.08 kg/m2  SpO2 100%  LMP 03/23/2014 Physical Exam  Nursing note and vitals reviewed. Constitutional: She is oriented to person, place, and time. She appears well-developed and well-nourished. No distress.  HENT:  Head: Normocephalic and atraumatic.  Mouth/Throat: Oropharynx is clear and moist. No oropharyngeal exudate.  Eyes: Conjunctivae and EOM are normal. Pupils are equal, round, and reactive to light.  Neck: Normal range of motion. Neck supple.  Cardiovascular: Normal rate, regular rhythm, normal heart sounds and intact distal pulses.   No murmur heard. Pulmonary/Chest: Effort normal and breath sounds normal. No respiratory distress.  Abdominal: Soft. There is no tenderness. There is no rebound and no guarding.  Musculoskeletal: Normal range of motion. She exhibits  no edema and no tenderness.  Small, 1 cm x 1 cm area of purplish-discoloration on the posterior aspect of the right knee. No significant tenderness to palpation. Normal range of motion of the right knee. 2+ distal pulses.  Neurological: She is alert and oriented to person, place, and time. No cranial nerve deficit. She exhibits normal muscle tone. Coordination normal.  Skin: Skin is warm.  Psychiatric: She has a normal mood and affect. Her behavior is normal.    ED Course  Procedures (including critical care time) DIAGNOSTIC STUDIES: Oxygen Saturation is 100% on RA, normal by my interpretation.    COORDINATION OF CARE: 10:07 PM- Patient informed of current plan for treatment and evaluation and agrees with plan at this time.  Labs Review Labs Reviewed - No data to display  Imaging Review No results found.   EKG Interpretation None      MDM   Final diagnoses:  Ecchymosis    10:28 PM 38 y.o. female w/ small bruise behind her knee after exercising today (squats). Very mild ttp. Likely just a small bruise. Reassurance provided.   10:28 PM:  I have discussed the diagnosis/risks/treatment options with the patient and believe the pt to be eligible for discharge home to follow-up with pcp as needed. We also discussed returning to the ED immediately if new or worsening sx occur. We discussed the sx which are most concerning (e.g., worsening pain, spreading of bruise) that necessitate immediate return. Medications administered to the patient during their visit and any new prescriptions provided to the patient are listed below.  Medications given during this visit Medications - No data to display  New Prescriptions   No medications on file      I personally performed the services described in this documentation, which was scribed in my presence. The recorded information has been reviewed and is accurate.    Junius Argyle, MD 04/23/14 [phone]

## 2014-04-22 NOTE — ED Notes (Signed)
Pt with bruise noted to back of patellar area, denies tenderness or any pain in leg.  Slightly swollen at site.  Denies any injury.

## 2015-01-23 ENCOUNTER — Encounter (HOSPITAL_BASED_OUTPATIENT_CLINIC_OR_DEPARTMENT_OTHER): Payer: Self-pay | Admitting: Emergency Medicine

## 2015-01-23 ENCOUNTER — Emergency Department (HOSPITAL_BASED_OUTPATIENT_CLINIC_OR_DEPARTMENT_OTHER)
Admission: EM | Admit: 2015-01-23 | Discharge: 2015-01-23 | Disposition: A | Discharge status: Home / Self Care | Payer: No Typology Code available for payment source | Attending: Emergency Medicine | Admitting: Emergency Medicine

## 2015-01-23 DIAGNOSIS — M545 Low back pain, unspecified: Secondary | ICD-10-CM

## 2015-01-23 DIAGNOSIS — Z87891 Personal history of nicotine dependence: Secondary | ICD-10-CM | POA: Insufficient documentation

## 2015-01-23 DIAGNOSIS — Z79899 Other long term (current) drug therapy: Secondary | ICD-10-CM | POA: Insufficient documentation

## 2015-01-23 MED ORDER — TRAMADOL HCL 50 MG PO TABS: 50.0000 mg | ORAL_TABLET | Freq: Four times a day (QID) | ORAL | Status: DC | PRN

## 2015-01-23 MED ORDER — IBUPROFEN 600 MG PO TABS: 600.0000 mg | ORAL_TABLET | Freq: Three times a day (TID) | ORAL | Status: DC

## 2015-01-23 MED ORDER — DIAZEPAM 5 MG PO TABS: 5.0000 mg | ORAL_TABLET | Freq: Every day | ORAL | Status: AC

## 2015-01-23 NOTE — ED Notes (Signed)
Pt c/o lower back pain pressure. Reports laying on heating pad but still feels pressure.

## 2015-01-23 NOTE — Discharge Instructions (Signed)
As discussed, your evaluation today has been largely reassuring.  But, it is important that you monitor your condition carefully, and do not hesitate to return to the ED if you develop new, or concerning changes in your condition.  If your symptoms have not improved, please be sure to follow-up with our sports medicine doctor, located in this facility.  For the next 3 days, please take all medication as directed.  Please use ice packs, 4 times daily.

## 2015-01-23 NOTE — ED Notes (Signed)
MD at bedside. 

## 2015-01-23 NOTE — ED Provider Notes (Signed)
CSN: [id number]     Arrival date & time 01/23/15  0740 History   First MD Initiated Contact with Patient 01/23/15 [phone]     Chief Complaint  Patient presents with  . Back Pain     (Consider location/radiation/quality/duration/timing/severity/associated sxs/prior Treatment) HPI Patient presents with concern of 2 days of lower back pain. The pain has been present consistently, is midline, lower, nonradiating. Pain is sore, severe, not improved with aspirin. Patient has history of intermittent back pain, though typically improves with aspirin. Patient denies abdominal pain, incontinence, asymmetric weakness, fever, chills. 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  Past Medical History  Diagnosis Date  . Ectopic pregnancy, tubal   . Palpitation    Past Surgical History  Procedure Laterality Date  . Foot surgery      Left  . Ectopic pregnancy surgery      2   Family History  Problem Relation Age of Onset  . CAD Father 7250  . Heart disease Mother 3240    Palpitations and murmur   History  Substance Use Topics  . Smoking status: Former Smoker    Types: Cigarettes  . Smokeless tobacco: Not on file     Comment: Quit tobacco 04  . Alcohol Use: Yes     Comment: occ   OB History    No data available     Review of Systems  Constitutional: Negative.   Respiratory: Negative.   Cardiovascular: Negative.   Gastrointestinal: Negative.   Genitourinary: Negative.   Neurological: Negative for weakness.      Allergies  Review of patient's allergies indicates no known allergies.  Home Medications   Prior to Admission medications   Medication Sig Start Date End Date Taking? Authorizing Provider  Ascorbic Acid (VITAMIN C) 500 MG CHEW Chew by mouth.   Yes Historical Provider, MD  Prenatal Vit-Fe Fumarate-FA (PRENATAL VITAMINS PLUS) 27-1 MG TABS Take by mouth.   Yes Historical Provider, MD  VITAMIN B1-B12 PO Take 500 Units by mouth.   Yes  Historical Provider, MD  diazepam (VALIUM) 5 MG tablet Take 1 tablet (5 mg total) by mouth daily. Take at night 01/23/15 01/25/15  Gerhard Munchobert Kirke Breach, MD  ibuprofen (ADVIL,MOTRIN) 600 MG tablet Take 1 tablet (600 mg total) by mouth 3 (three) times daily. 01/23/15   Gerhard Munchobert Council Munguia, MD  traMADol (ULTRAM) 50 MG tablet Take 1 tablet (50 mg total) by mouth every 6 (six) hours as needed (breakthrough pain). 01/23/15   Gerhard Munchobert Mattie Novosel, MD   BP 111/66 mmHg  Pulse 51  Temp(Src) 98.3 F (36.8 C) (Oral)  Resp 16  Ht 5\' 4"  (1.626 m)  Wt 150 lb (68.04 kg)  BMI 25.73 kg/m2  SpO2 100%  LMP 01/23/2015 (Exact Date) Physical Exam  Constitutional: She is oriented to person, place, and time. She appears well-developed and well-nourished. No distress.  HENT:  Head: Normocephalic and atraumatic.  Eyes: Conjunctivae and EOM are normal.  Cardiovascular: Normal rate and regular rhythm.   Pulmonary/Chest: Effort normal and breath sounds normal. No stridor. No respiratory distress.  Abdominal: She exhibits no distension.  Musculoskeletal: She exhibits no edema.  Neurological: She is alert and oriented to person, place, and time. No cranial nerve deficit.  Symmetric strength in proximal and distal lower extremity muscles, no appreciable deformity in the midline lower back.   Skin: Skin is warm and dry.  Psychiatric: She has a normal mood and affect.  Nursing note and vitals reviewed.  ED Course  Procedures (including critical care time)   MDM   Final diagnoses:  Midline low back pain without sciatica   well-appearing female presents with midline low back pain. No neurologic complaints, physical exam red flag findings. Patient started on a course of anti-inflammatories, discharged in stable condition.  Gerhard Munch, MD 01/23/15 [phone]

## 2015-01-28 ENCOUNTER — Encounter: Payer: Self-pay | Admitting: Family Medicine

## 2015-01-28 ENCOUNTER — Ambulatory Visit (INDEPENDENT_AMBULATORY_CARE_PROVIDER_SITE_OTHER): Payer: Self-pay | Admitting: Family Medicine

## 2015-01-28 VITALS — BP 130/90 | HR 66 | Ht 65.0 in | Wt 150.0 lb

## 2015-01-28 DIAGNOSIS — M545 Low back pain, unspecified: Secondary | ICD-10-CM

## 2015-01-28 NOTE — Patient Instructions (Signed)
You have a lumbar strain, less likely a small disc herniation of the lumbar spine. Ibuprofen 600mg  three times a day with food for pain and inflammation - take regularly for 7-10 days then as needed. Do home exercises/stretches every day - hold stretches for 20-30 seconds at a time;  Exercises you would do 3 sets of 10 once a day. Do the exercises/stretches for 6 weeks even if you're better much sooner than that. Consider tramadol as needed for severe pain (no driving on this medicine). Consider robaxin for muscle spasms. Stay as active as possible. Physical therapy has been shown to be helpful as well if you're not improving as expected. Strengthening of low back muscles, abdominal musculature are key for long term pain relief. If not improving, will consider further imaging (MRI). Follow up with me in 4 weeks or as needed.

## 2015-02-02 DIAGNOSIS — M545 Low back pain, unspecified: Secondary | ICD-10-CM | POA: Insufficient documentation

## 2015-02-02 NOTE — Progress Notes (Signed)
PCP: No PCP Per Patient  Subjective:   HPI: Patient is a 39 y.o. female here for low back pain.  Patient reports she was lifting a piece of furniture on 4/28 when she felt a pain on right side of back with a catch. Pain worsened and is up to 7/10 now. No radiation into legs. No numbness/tingling. Tried valium, tramadol, ibuprofen. 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 No prior issues. Worse when sitting as well.  Past Medical History  Diagnosis Date  . Ectopic pregnancy, tubal   . Palpitation     Current Outpatient Prescriptions on File Prior to Visit  Medication Sig Dispense Refill  . Ascorbic Acid (VITAMIN C) 500 MG CHEW Chew by mouth.    Marland Kitchen. ibuprofen (ADVIL,MOTRIN) 600 MG tablet Take 1 tablet (600 mg total) by mouth 3 (three) times daily. 10 tablet 0  . Prenatal Vit-Fe Fumarate-FA (PRENATAL VITAMINS PLUS) 27-1 MG TABS Take by mouth.    . traMADol (ULTRAM) 50 MG tablet Take 1 tablet (50 mg total) by mouth every 6 (six) hours as needed (breakthrough pain). 15 tablet 0  . VITAMIN B1-B12 PO Take 500 Units by mouth.     No current facility-administered medications on file prior to visit.    Past Surgical History  Procedure Laterality Date  . Foot surgery      Left  . Ectopic pregnancy surgery      2    No Known Allergies  History   Social History  . Marital Status: Married    Spouse Name: N/A  . Number of Children: 0  . Years of Education: N/A   Occupational History  . Student and works in childcare    Social History Main Topics  . Smoking status: Former Smoker    Types: Cigarettes  . Smokeless tobacco: Not on file     Comment: Quit tobacco 04  . Alcohol Use: 0.0 oz/week    0 Standard drinks or equivalent per week     Comment: occ  . Drug Use: No  . Sexual Activity: Yes    Birth Control/ Protection: None   Other Topics Concern  . Not on file   Social History Narrative   Lives with boyfriend.    Exercises regularly     Family History  Problem Relation Age of Onset  . CAD Father 3250  . Heart disease Mother 2840    Palpitations and murmur    BP 130/90 mmHg  Pulse 66  Ht 5\' 5"  (1.651 m)  Wt 150 lb (68.04 kg)  BMI 24.96 kg/m2  LMP 01/23/2015 (Exact Date)  Review of Systems: See HPI above.    Objective:  Physical Exam:  Gen: NAD  Back: No gross deformity, scoliosis. TTP right paraspinal lumbar region.  No midline or bony TTP. FROM with pain on flexion. Strength LEs 5/5 all muscle groups.   2+ MSRs in patellar and achilles tendons, equal bilaterally. Negative SLRs. Sensation intact to light touch bilaterally. Negative logroll bilateral hips Negative fabers and piriformis stretches.    Assessment & Plan:  1. Low back pain - 2/2 lumbar strain.  Ibuprofen regularly for 7-10 days then as needed.  Shown home exercises to do daily.  Consider physical therapy, MRI if not improving.  F/u in 4 weeks or prn.

## 2015-02-02 NOTE — Assessment & Plan Note (Signed)
2/2 lumbar strain.  Ibuprofen regularly for 7-10 days then as needed.  Shown home exercises to do daily.  Consider physical therapy, MRI if not improving.  F/u in 4 weeks or prn.

## 2015-08-04 ENCOUNTER — Encounter: Payer: Self-pay | Admitting: Family Medicine

## 2015-10-13 ENCOUNTER — Emergency Department (HOSPITAL_BASED_OUTPATIENT_CLINIC_OR_DEPARTMENT_OTHER): Payer: No Typology Code available for payment source

## 2015-10-13 ENCOUNTER — Encounter (HOSPITAL_BASED_OUTPATIENT_CLINIC_OR_DEPARTMENT_OTHER): Payer: Self-pay | Admitting: *Deleted

## 2015-10-13 ENCOUNTER — Emergency Department (HOSPITAL_BASED_OUTPATIENT_CLINIC_OR_DEPARTMENT_OTHER)
Admission: EM | Admit: 2015-10-13 | Discharge: 2015-10-13 | Disposition: A | Discharge status: Home / Self Care | Payer: Self-pay | Attending: Emergency Medicine | Admitting: Emergency Medicine

## 2015-10-13 DIAGNOSIS — Z791 Long term (current) use of non-steroidal anti-inflammatories (NSAID): Secondary | ICD-10-CM | POA: Insufficient documentation

## 2015-10-13 DIAGNOSIS — R05 Cough: Secondary | ICD-10-CM | POA: Insufficient documentation

## 2015-10-13 DIAGNOSIS — R002 Palpitations: Secondary | ICD-10-CM | POA: Insufficient documentation

## 2015-10-13 DIAGNOSIS — Z79899 Other long term (current) drug therapy: Secondary | ICD-10-CM | POA: Insufficient documentation

## 2015-10-13 DIAGNOSIS — F17210 Nicotine dependence, cigarettes, uncomplicated: Secondary | ICD-10-CM | POA: Insufficient documentation

## 2015-10-13 DIAGNOSIS — R0602 Shortness of breath: Secondary | ICD-10-CM | POA: Insufficient documentation

## 2015-10-13 DIAGNOSIS — R079 Chest pain, unspecified: Secondary | ICD-10-CM | POA: Insufficient documentation

## 2015-10-13 DIAGNOSIS — F419 Anxiety disorder, unspecified: Secondary | ICD-10-CM | POA: Insufficient documentation

## 2015-10-13 LAB — BASIC METABOLIC PANEL
ANION GAP: 8 (ref 5–15)
BUN: 14 mg/dL (ref 6–20)
CALCIUM: 9.3 mg/dL (ref 8.9–10.3)
CHLORIDE: 106 mmol/L (ref 101–111)
CO2: 28 mmol/L (ref 22–32)
CREATININE: 0.99 mg/dL (ref 0.44–1.00)
GFR calc Af Amer: >60 mL/min (ref >60)
GFR calc non Af Amer: >60 mL/min (ref >60)
GLUCOSE: 132 mg/dL — AB (ref 65–99)
Potassium: 3.9 mmol/L (ref 3.5–5.1)
Sodium: 142 mmol/L (ref 135–145)

## 2015-10-13 LAB — D-DIMER, QUANTITATIVE: D-Dimer, Quant: <0.27 ug/mL-FEU (ref 0.00–0.50)

## 2015-10-13 NOTE — ED Notes (Signed)
MD at bedside. 

## 2015-10-13 NOTE — ED Notes (Signed)
Patient is alert and oriented x3.  She was given DC instructions and follow up visit instructions.  Patient gave verbal understanding. She was DC ambulatory under her own power to home.  V/S stable.  He was not showing any signs of distress on DC 

## 2015-10-13 NOTE — Discharge Instructions (Signed)
Palpitations There is no evidence of heart attack or blood clot in the lung. Follow up with your doctor, stop smoking. Return to the ED if you develop new or worsening symptoms. A palpitation is the feeling that your heartbeat is irregular or is faster than normal. It may feel like your heart is fluttering or skipping a beat. Palpitations are usually not a serious problem. However, in some cases, you may need further medical evaluation. CAUSES  Palpitations can be caused by:  Smoking.  Caffeine or other stimulants, such as diet pills or energy drinks.  Alcohol.  Stress and anxiety.  Strenuous physical activity.  Fatigue.  Certain medicines.  Heart disease, especially if you have a history of irregular heart rhythms (arrhythmias), such as atrial fibrillation, atrial flutter, or supraventricular tachycardia.  An improperly working pacemaker or defibrillator. DIAGNOSIS  To find the cause of your palpitations, your health care provider will take your medical history and perform a physical exam. Your health care provider may also have you take a test called an ambulatory electrocardiogram (ECG). An ECG records your heartbeat patterns over a 24-hour period. You may also have other tests, such as:  Transthoracic echocardiogram (TTE). During echocardiography, sound waves are used to evaluate how blood flows through your heart.  Transesophageal echocardiogram (TEE).  Cardiac monitoring. This allows your health care provider to monitor your heart rate and rhythm in real time.  Holter monitor. This is a portable device that records your heartbeat and can help diagnose heart arrhythmias. It allows your health care provider to track your heart activity for several days, if needed.  Stress tests by exercise or by giving medicine that makes the heart beat faster. TREATMENT  Treatment of palpitations depends on the cause of your symptoms and can vary greatly. Most cases of palpitations do not  require any treatment other than time, relaxation, and monitoring your symptoms. Other causes, such as atrial fibrillation, atrial flutter, or supraventricular tachycardia, usually require further treatment. HOME CARE INSTRUCTIONS   Avoid:  Caffeinated coffee, tea, soft drinks, diet pills, and energy drinks.  Chocolate.  Alcohol.  Stop smoking if you smoke.  Reduce your stress and anxiety. Things that can help you relax include:  A method of controlling things in your body, such as your heartbeats, with your mind (biofeedback).  Yoga.  Meditation.  Physical activity such as swimming, jogging, or walking.  Get plenty of rest and sleep. SEEK MEDICAL CARE IF:   You continue to have a fast or irregular heartbeat beyond 24 hours.  Your palpitations occur more often. SEEK IMMEDIATE MEDICAL CARE IF:  You have chest pain or shortness of breath.  You have a severe headache.  You feel dizzy or you faint. MAKE SURE YOU:  Understand these instructions.  Will watch your condition.  Will get help right away if you are not doing well or get worse.   This information is not intended to replace advice given to you by your health care provider. Make sure you discuss any questions you have with your health care provider.   Document Released: 09/09/2000 Document Revised: 09/17/2013 Document Reviewed: 11/11/2011 Elsevier Interactive Patient Education [employer]

## 2015-10-13 NOTE — ED Notes (Signed)
Pt reports she was driving home, smoked a couple puffs of a cigarette then suddenly became very SOB. Tried to "clear her lungs" and felt her heart start beating very fast. Sx had resolved upon arrival to ED. States several similar episodes in the last 6 months

## 2015-10-13 NOTE — ED Provider Notes (Signed)
CSN: [id number]     Arrival date & time 10/13/15  1732 History   First MD Initiated Contact with Patient 10/13/15 1743     Chief Complaint  Patient presents with  . Shortness of Breath  . Palpitations     (Consider location/radiation/quality/duration/timing/severity/associated sxs/prior Treatment) HPI Comments: Patient reports shortness of breath and palpitations that onset after she was smoking a cigar. She became very short of breath and felt her heart race, became anxious, and rolled the window down to help her breathing. She has some chest tightness but no chest pain. Symptoms lasted about 10 minutes and has since resolved. She feels back to baseline now. Symptoms have occurred before with smoking. She denies any history of asthma. She does not smoke regularly. No leg pain or leg swelling. No blood thinner use. No birth control use.  Patient is a 40 y.o. female presenting with palpitations. The history is provided by the patient.  Palpitations Associated symptoms: cough and shortness of breath   Associated symptoms: no dizziness, no nausea, no vomiting and no weakness     Past Medical History  Diagnosis Date  . Racing heart beat     avoids caffine/chocolate  . Ectopic pregnancy, tubal   . Palpitation    Past Surgical History  Procedure Laterality Date  . Foot surgery       l foot   . Foot surgery      Left  . Ectopic pregnancy surgery      2   Family History  Problem Relation Age of Onset  . CAD Father 56  . Heart disease Mother 48    Palpitations and murmur   Social History  Substance Use Topics  . Smoking status: Current Some Day Smoker    Types: Cigars  . Smokeless tobacco: Never Used     Comment: Quit tobacco 04  . Alcohol Use: Yes     Comment: 1 drink/day   OB History    Gravida Para Term Preterm AB TAB SAB Ectopic Multiple Living   0 0 0 0 0 0 0 0       Review of Systems  Constitutional: Negative for fever, activity change and appetite change.  HENT:  Negative for congestion.   Eyes: Negative for visual disturbance.  Respiratory: Positive for cough, chest tightness and shortness of breath.   Cardiovascular: Positive for palpitations.  Gastrointestinal: Negative for nausea, vomiting and abdominal pain.  Genitourinary: Negative for dysuria, hematuria, vaginal bleeding and vaginal discharge.  Musculoskeletal: Negative for myalgias and arthralgias.  Skin: Negative for rash.  Neurological: Negative for dizziness, weakness, light-headedness and headaches.  A complete 10 system review of systems was obtained and all systems are negative except as noted in the HPI and PMH.      Allergies  Vicodin  Home Medications   Prior to Admission medications   Medication Sig Start Date End Date Taking? Authorizing Provider  Ascorbic Acid (VITAMIN C) 500 MG CHEW Chew by mouth.    Historical Provider, MD  Cyanocobalamin (VITAMIN B12 PO) Take by mouth.    Historical Provider, MD  HYDROcodone-acetaminophen (NORCO/VICODIN) 5-325 MG per tablet 1-2 tablets po q 6 hours prn moderate to severe pain 07/14/12   Quita Skye, MD  ibuprofen (ADVIL,MOTRIN) 600 MG tablet Take 1 tablet (600 mg total) by mouth 3 (three) times daily. 01/23/15   Gerhard Munch, MD  ibuprofen (ADVIL,MOTRIN) 800 MG tablet Take 1 tablet (800 mg total) by mouth 3 (three) times daily. 07/14/12   Casimiro Needle  Ghim, MD  Multiple Vitamins-Minerals (MULTIVITAMIN WITH MINERALS) tablet Take 1 tablet by mouth daily.    Historical Provider, MD  Prenatal Vit-Fe Fumarate-FA (PRENATAL VITAMINS PLUS) 27-1 MG TABS Take by mouth.    Historical Provider, MD  traMADol (ULTRAM) 50 MG tablet Take 1 tablet (50 mg total) by mouth every 6 (six) hours as needed (breakthrough pain). 01/23/15   Gerhard Munch, MD  VITAMIN B1-B12 PO Take 500 Units by mouth.    Historical Provider, MD   BP 138/86 mmHg  Pulse 73  Resp 22  Ht  (1.651 m)  Wt 156 lb (70.761 kg)  BMI 25.96 kg/m2  SpO2 100%  LMP 10/11/2015 Physical  Exam  Constitutional: She is oriented to person, place, and time. She appears well-developed and well-nourished. No distress.  HENT:  Head: Normocephalic and atraumatic.  Mouth/Throat: Oropharynx is clear and moist. No oropharyngeal exudate.  Eyes: Conjunctivae and EOM are normal. Pupils are equal, round, and reactive to light.  Neck: Normal range of motion. Neck supple.  No meningismus.  Cardiovascular: Normal rate, regular rhythm, normal heart sounds and intact distal pulses.   No murmur heard. Pulmonary/Chest: Effort normal and breath sounds normal. No respiratory distress. She has no wheezes. She exhibits no tenderness.  Abdominal: Soft. There is no tenderness. There is no rebound and no guarding.  Musculoskeletal: Normal range of motion. She exhibits no edema or tenderness.  Neurological: She is alert and oriented to person, place, and time. No cranial nerve deficit. She exhibits normal muscle tone. Coordination normal.  No ataxia on finger to nose bilaterally. No pronator drift. 5/5 strength throughout. CN 2-12 intact.Equal grip strength. Sensation intact.   Skin: Skin is warm.  Psychiatric: She has a normal mood and affect. Her behavior is normal.  Nursing note and vitals reviewed.   ED Course  Procedures (including critical care time) Labs Review Labs Reviewed  BASIC METABOLIC PANEL - Abnormal; Notable for the following:    Glucose, Bld 132 (*)    All other components within normal limits  D-DIMER, QUANTITATIVE (NOT AT [facility])    Imaging Review Dg Chest 2 View  10/13/2015  CLINICAL DATA:  40 year old female with history of acute onset of shortness of breath while smoking a cigarette. EXAM: CHEST  2 VIEW COMPARISON:  No priors. FINDINGS: Lung volumes are normal. No consolidative airspace disease. No pleural effusions. No pneumothorax. No pulmonary nodule or mass noted. Pulmonary vasculature and the cardiomediastinal silhouette are within normal limits. IMPRESSION: No radiographic  evidence of acute cardiopulmonary disease. Electronically Signed   By: Trudie Reed M.D.   On: 10/13/2015 18:15   I have personally reviewed and evaluated these images and lab results as part of my medical decision-making.   EKG Interpretation   Date/Time:  Tuesday October 13 2015 17:49:25 EST Ventricular Rate:  79 PR Interval:  155 QRS Duration: 81 QT Interval:  393 QTC Calculation: 450 R Axis:   59 Text Interpretation:  Sinus rhythm Abnormal R-wave progression, early  transition Baseline wander in lead(s) I II III aVL aVF No significant  change was found Confirmed by Manus Gunning  MD, Kyna Blahnik [phone]) on 10/13/2015  6:01:10 PM      MDM   Final diagnoses:  Palpitations   Palpitations and shortness of breath that onset after smoking a cigar. Symptoms have resolved. Her lungs are clear. No hypoxia. EKG is normal sinus rhythm.  Chest x-ray negative for pneumothorax. EKG unchanged. D-dimer negative.  Low suspicion for ACS or PE. Suspect anxiety  attack. The patient back to baseline now. Smoking cessation encouraged. Return precautions discussed.    Glynn Octave, MD 10/13/15 [phone]

## 2016-03-21 ENCOUNTER — Encounter (HOSPITAL_BASED_OUTPATIENT_CLINIC_OR_DEPARTMENT_OTHER): Payer: Self-pay | Admitting: Emergency Medicine

## 2016-03-21 ENCOUNTER — Emergency Department (HOSPITAL_BASED_OUTPATIENT_CLINIC_OR_DEPARTMENT_OTHER)
Admission: EM | Admit: 2016-03-21 | Discharge: 2016-03-21 | Disposition: A | Discharge status: Home / Self Care | Payer: BLUE CROSS/BLUE SHIELD | Attending: Emergency Medicine | Admitting: Emergency Medicine

## 2016-03-21 ENCOUNTER — Emergency Department (HOSPITAL_BASED_OUTPATIENT_CLINIC_OR_DEPARTMENT_OTHER): Payer: BLUE CROSS/BLUE SHIELD

## 2016-03-21 DIAGNOSIS — Z87891 Personal history of nicotine dependence: Secondary | ICD-10-CM | POA: Insufficient documentation

## 2016-03-21 DIAGNOSIS — S99911A Unspecified injury of right ankle, initial encounter: Secondary | ICD-10-CM | POA: Diagnosis present

## 2016-03-21 DIAGNOSIS — S93401A Sprain of unspecified ligament of right ankle, initial encounter: Secondary | ICD-10-CM | POA: Insufficient documentation

## 2016-03-21 DIAGNOSIS — Y929 Unspecified place or not applicable: Secondary | ICD-10-CM | POA: Insufficient documentation

## 2016-03-21 DIAGNOSIS — Y999 Unspecified external cause status: Secondary | ICD-10-CM | POA: Diagnosis not present

## 2016-03-21 DIAGNOSIS — X501XXA Overexertion from prolonged static or awkward postures, initial encounter: Secondary | ICD-10-CM | POA: Insufficient documentation

## 2016-03-21 DIAGNOSIS — Y939 Activity, unspecified: Secondary | ICD-10-CM | POA: Diagnosis not present

## 2016-03-21 NOTE — ED Notes (Signed)
Patient states that she stepped off the porch and rolled her right ankle pain

## 2016-03-21 NOTE — ED Provider Notes (Signed)
CSN: [id number]     Arrival date & time 03/21/16  1400 History   First MD Initiated Contact with Patient 03/21/16 1456     Chief Complaint  Patient presents with  . Ankle Pain     (Consider location/radiation/quality/duration/timing/severity/associated sxs/prior Treatment) HPI   Patient is a 40 year old female who presents the ED with complaint of right ankle pain. Patient reports she was stepping off of the porch when her right foot inverted resulting her rolling her right ankle. Denies head injury or LOC. Patient reports having pain and mild swelling and bruising to her right ankle. Patient denies taking any medications prior to arrival. Denies fever, redness, numbness, tingling, weakness. Patient reports she is able to bear weight but endorses mild discomfort.  Past Medical History  Diagnosis Date  . Racing heart beat     avoids caffine/chocolate  . Ectopic pregnancy, tubal   . Palpitation    Past Surgical History  Procedure Laterality Date  . Foot surgery       l foot   . Foot surgery      Left  . Ectopic pregnancy surgery      2   Family History  Problem Relation Age of Onset  . CAD Father 3850  . Heart disease Mother 2940    Palpitations and murmur   Social History  Substance Use Topics  . Smoking status: Former Games developermoker  . Smokeless tobacco: Never Used     Comment: Quit tobacco 04  . Alcohol Use: Yes     Comment: 1 drink/day   OB History    Gravida Para Term Preterm AB TAB SAB Ectopic Multiple Living   0 0 0 0 0 0 0 0       Review of Systems  Constitutional: Negative for fever.  Musculoskeletal: Positive for joint swelling (Right ankle/foot) and arthralgias (right ankle).  Skin: Negative for wound.  Neurological: Negative for weakness and numbness.  All other systems reviewed and are negative.     Allergies  Vicodin  Home Medications   Prior to Admission medications   Medication Sig Start Date End Date Taking? Authorizing Provider  Ascorbic Acid  (VITAMIN C) 500 MG CHEW Chew by mouth.    Historical Provider, MD  Cyanocobalamin (VITAMIN B12 PO) Take by mouth.    Historical Provider, MD  HYDROcodone-acetaminophen (NORCO/VICODIN) 5-325 MG per tablet 1-2 tablets po q 6 hours prn moderate to severe pain 07/14/12   Quita SkyeMichael Ghim, MD  ibuprofen (ADVIL,MOTRIN) 600 MG tablet Take 1 tablet (600 mg total) by mouth 3 (three) times daily. 01/23/15   Gerhard Munchobert Lockwood, MD  ibuprofen (ADVIL,MOTRIN) 800 MG tablet Take 1 tablet (800 mg total) by mouth 3 (three) times daily. 07/14/12   Quita SkyeMichael Ghim, MD  Multiple Vitamins-Minerals (MULTIVITAMIN WITH MINERALS) tablet Take 1 tablet by mouth daily.    Historical Provider, MD  Prenatal Vit-Fe Fumarate-FA (PRENATAL VITAMINS PLUS) 27-1 MG TABS Take by mouth.    Historical Provider, MD  traMADol (ULTRAM) 50 MG tablet Take 1 tablet (50 mg total) by mouth every 6 (six) hours as needed (breakthrough pain). 01/23/15   Gerhard Munchobert Lockwood, MD  VITAMIN B1-B12 PO Take 500 Units by mouth.    Historical Provider, MD   BP 119/76 mmHg  Pulse 53  Temp(Src) 99 F (37.2 C) (Oral)  Resp 16  Ht 5\' 5"  (1.651 m)  Wt 70.761 kg  BMI 25.96 kg/m2  SpO2 100%  LMP 03/21/2016 Physical Exam  Constitutional: She is oriented to person, place, and  time. She appears well-developed and well-nourished.  HENT:  Head: Normocephalic and atraumatic.  Eyes: Conjunctivae and EOM are normal. Right eye exhibits no discharge. Left eye exhibits no discharge. No scleral icterus.  Neck: Normal range of motion. Neck supple.  Cardiovascular: Normal rate.   Pulmonary/Chest: Effort normal.  Musculoskeletal: Normal range of motion. She exhibits tenderness. She exhibits no edema.       Right knee: Normal. She exhibits normal range of motion, no swelling, no effusion, no ecchymosis, no deformity, no laceration, no erythema, normal alignment, no LCL laxity, normal patellar mobility and no MCL laxity. No tenderness found.       Right ankle: She exhibits swelling  and ecchymosis. She exhibits normal range of motion, no deformity, no laceration and normal pulse. Tenderness. Achilles tendon normal.       Right foot: There is tenderness. There is normal range of motion, no swelling, normal capillary refill, no crepitus, no deformity and no laceration.       Feet:  Full range of motion of right knee, ankle and foot. Mild swelling, ecchymoses and tenderness noted to right proximal/lateral forefoot. 5 out of 5 strength. 2+ DP pulse. Sensation grossly intact. Cap refill less than 2.  Neurological: She is alert and oriented to person, place, and time.  Skin: Skin is warm and dry.  Nursing note and vitals reviewed.   ED Course  Procedures (including critical care time) Labs Review Labs Reviewed - No data to display  Imaging Review Dg Ankle Complete Right  03/21/2016  CLINICAL DATA:  Rolled ankle today with lateral ankle pain. Initial encounter. EXAM: RIGHT ANKLE - COMPLETE 3+ VIEW COMPARISON:  None. FINDINGS: There is no evidence of fracture, dislocation, or joint effusion. There is no evidence of arthropathy or other focal bone abnormality. Soft tissues are unremarkable. IMPRESSION: Negative. Electronically Signed   By: Marnee SpringJonathon  Watts M.D.   On: 03/21/2016 14:31   I have personally reviewed and evaluated these images and lab results as part of my medical decision-making.   EKG Interpretation None      MDM   Final diagnoses:  Ankle sprain, right, initial encounter    Patient presented with right ankle pain and swelling after rolling her ankle while stepping off of her porch step. Denies head injury or LOC. VSS. Exam revealed mild swelling, ecchymoses and tenderness noted to right proximal/lateral forefoot. Right lower extremity neurovascularly intact. No other evidence of injury. Right ankle x-ray negative. Discussed results and plan for discharge with patient. Ace wrap applied to right ankle in the ED. Plan to discharge patient home with symptomatic  treatment including NSAIDs and RICE protocol. Advised patient to follow up with her orthopedist in the next 1-2 weeks as needed if her pain has not improved. Discussed return precautions to patient.    Satira Sarkicole Elizabeth StanleyNadeau, [place] JerseyPA-C 03/21/16 1552  Benjiman CoreNathan Pickering, MD 03/22/16 [phone]

## 2016-03-21 NOTE — Discharge Instructions (Signed)
I recommend resting, elevating and applying ice to her right ankle for 15-20 minutes 3-4 times daily to help with pain and swelling. You may apply an Ace wrap to her right ankle as needed for comfort. He may also take 600 mg ibuprofen 4 times daily as needed for pain and swelling. He may bear weight on her right foot/ankle however I recommend refraining from running or staining on your feet for long periods of time over the next few days and today her symptoms have improved. I recommend following up with her orthopedist in the next 1-2 weeks if your symptoms have not improved. Return to the emergency department if symptoms worsen or new onset of fever, worsening redness, swelling, warmth, numbness, tingling.

## 2016-04-30 ENCOUNTER — Encounter (HOSPITAL_BASED_OUTPATIENT_CLINIC_OR_DEPARTMENT_OTHER): Payer: Self-pay | Admitting: *Deleted

## 2016-04-30 ENCOUNTER — Emergency Department (HOSPITAL_BASED_OUTPATIENT_CLINIC_OR_DEPARTMENT_OTHER)
Admission: EM | Admit: 2016-04-30 | Discharge: 2016-04-30 | Disposition: A | Discharge status: Home / Self Care | Payer: BLUE CROSS/BLUE SHIELD | Attending: Emergency Medicine | Admitting: Emergency Medicine

## 2016-04-30 DIAGNOSIS — Z87891 Personal history of nicotine dependence: Secondary | ICD-10-CM | POA: Insufficient documentation

## 2016-04-30 DIAGNOSIS — H578 Other specified disorders of eye and adnexa: Secondary | ICD-10-CM | POA: Diagnosis present

## 2016-04-30 DIAGNOSIS — H01003 Unspecified blepharitis right eye, unspecified eyelid: Secondary | ICD-10-CM | POA: Diagnosis not present

## 2016-04-30 MED ORDER — FLUORESCEIN SODIUM 1 MG OP STRP
1.0000 | ORAL_STRIP | Freq: Once | OPHTHALMIC | Status: AC
  Administered 2016-04-30: 1 via OPHTHALMIC
  Filled 2016-04-30: qty 1

## 2016-04-30 MED ORDER — TETRACAINE HCL 0.5 % OP SOLN
2.0000 [drp] | Freq: Once | OPHTHALMIC | Status: AC
  Administered 2016-04-30: 2 [drp] via OPHTHALMIC
  Filled 2016-04-30: qty 4

## 2016-04-30 MED ORDER — ERYTHROMYCIN 5 MG/GM OP OINT
TOPICAL_OINTMENT | Freq: Once | OPHTHALMIC | Status: AC
  Administered 2016-04-30: 1 via OPHTHALMIC
  Filled 2016-04-30: qty 3.5

## 2016-04-30 NOTE — ED Triage Notes (Signed)
Pt states feels as if something is in rt eye onset 2 days ago

## 2016-04-30 NOTE — ED Provider Notes (Signed)
[facility] Provider Note   CSN: [id number] Arrival date & time: 04/30/16  7829  First Provider Contact:  None       History   Chief Complaint Chief Complaint  Patient presents with  . Eye Pain    HPI Gwendolyn Padilla is a 40 y.o. female with no sig PMH, here with irritated eyes for the past 2 days.  Patient states he feels as if something is stuck in it. She denies any blurry vision. She does not recall anything flying into her eye. This happened with sudden onset when she was at home. 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 She states when she wakes up in the mornings there is crusting over her eye that is white and yellow color. She otherwise has no drainage.  She denies any trauma. There are no further complaints.  10 Systems reviewed and are negative for acute change except as noted in the HPI.    HPI  Past Medical History:  Diagnosis Date  . Ectopic pregnancy, tubal   . Palpitation   . Racing heart beat    avoids caffine/chocolate    Patient Active Problem List   Diagnosis Date Noted  . Low back pain 02/02/2015  . Palpitations 08/21/2012    Past Surgical History:  Procedure Laterality Date  . ECTOPIC PREGNANCY SURGERY     2  . FOOT SURGERY      l foot   . FOOT SURGERY     Left    OB History    Gravida Para Term Preterm AB Living   0 0 0 0 0     SAB TAB Ectopic Multiple Live Births   0 0 0           Home Medications    Prior to Admission medications   Medication Sig Start Date End Date Taking? Authorizing Provider  Ascorbic Acid (VITAMIN C) 500 MG CHEW Chew by mouth.    Historical Provider, MD  Cyanocobalamin (VITAMIN B12 PO) Take by mouth.    Historical Provider, MD  HYDROcodone-acetaminophen (NORCO/VICODIN) 5-325 MG per tablet 1-2 tablets po q 6 hours prn moderate to severe pain 07/14/12   Quita Skye, MD  ibuprofen (ADVIL,MOTRIN) 600 MG tablet Take 1 tablet (600 mg total) by mouth 3 (three) times daily. 01/23/15   Gerhard Munch, MD    ibuprofen (ADVIL,MOTRIN) 800 MG tablet Take 1 tablet (800 mg total) by mouth 3 (three) times daily. 07/14/12   Quita Skye, MD  Multiple Vitamins-Minerals (MULTIVITAMIN WITH MINERALS) tablet Take 1 tablet by mouth daily.    Historical Provider, MD  Prenatal Vit-Fe Fumarate-FA (PRENATAL VITAMINS PLUS) 27-1 MG TABS Take by mouth.    Historical Provider, MD  traMADol (ULTRAM) 50 MG tablet Take 1 tablet (50 mg total) by mouth every 6 (six) hours as needed (breakthrough pain). 01/23/15   Gerhard Munch, MD  VITAMIN B1-B12 PO Take 500 Units by mouth.    Historical Provider, MD    Family History Family History  Problem Relation Age of Onset  . CAD Father 43  . Heart disease Mother 44    Palpitations and murmur    Social History Social History  Substance Use Topics  . Smoking status: Former Games developer  . Smokeless tobacco: Never Used     Comment: Quit tobacco 04  . Alcohol use Yes     Comment: 1 drink/day     Allergies   Vicodin [hydrocodone-acetaminophen]   Review of Systems Review of Systems   Physical  Exam Updated Vital Signs BP 109/73 (BP Location: Right Arm)   Pulse 60   Temp 98.1 F (36.7 C) (Oral)   Resp 18   Ht 5\' 4"  (1.626 m)   Wt 152 lb (68.9 kg)   SpO2 100%   BMI 26.09 kg/m   Physical Exam  Constitutional: She is oriented to person, place, and time. She appears well-developed and well-nourished. No distress.  HENT:  Head: Normocephalic and atraumatic.  Nose: Nose normal.  Mouth/Throat: Oropharynx is clear and moist. No oropharyngeal exudate.  Eyes: Conjunctivae and EOM are normal. Pupils are equal, round, and reactive to light. No scleral icterus.  20/20 vision bilaterally. No corneal abrasion seen on first exam. No foreign bodies seen when retracting the eyelids both upper and lower. Mild irritation seen on the nasal aspect of the cornea. Crusting seen on the eyelids.  Neck: Normal range of motion. Neck supple. No JVD present. No tracheal deviation present.  No thyromegaly present.  Cardiovascular: Normal rate, regular rhythm and normal heart sounds.  Exam reveals no gallop and no friction rub.   No murmur heard. Pulmonary/Chest: Effort normal and breath sounds normal. No respiratory distress. She has no wheezes. She exhibits no tenderness.  Abdominal: Soft. Bowel sounds are normal. She exhibits no distension and no mass. There is no tenderness. There is no rebound and no guarding.  Musculoskeletal: Normal range of motion. She exhibits no edema or tenderness.  Lymphadenopathy:    She has no cervical adenopathy.  Neurological: She is alert and oriented to person, place, and time. No cranial nerve deficit. She exhibits normal muscle tone.  Skin: Skin is warm and dry. No rash noted. No erythema. No pallor.  Nursing note and vitals reviewed.    ED Treatments / Results  Labs (all labs ordered are listed, but only abnormal results are displayed) Labs Reviewed - No data to display  EKG  EKG Interpretation None       Radiology No results found.  Procedures Procedures (including critical care time)  Medications Ordered in ED Medications  erythromycin ophthalmic ointment (not administered)  fluorescein ophthalmic strip 1 strip (1 strip Left Eye Given 04/30/16 0442)  tetracaine (PONTOCAINE) 0.5 % ophthalmic solution 2 drop (2 drops Left Eye Given 04/30/16 0443)     Initial Impression / Assessment and Plan / ED Course  I have reviewed the triage vital signs and the nursing notes.  Pertinent labs & imaging results that were available during my care of the patient were reviewed by me and considered in my medical decision making (see chart for details).  Clinical Course    Patient presents to the emergency department for pain. Physical exam history is consistent with blepharitis. Will treat with erythromycin ophthalmic ointment, warm compresses, and gentle soap and water to the eye. 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  Education provided. Ophthalmology follow-up given as well. She appears well in no acute distress, vital signs were within her normal limits and she is safe for discharge.  Final Clinical Impressions(s) / ED Diagnoses   Final diagnoses:  Blepharitis, right    New Prescriptions New Prescriptions   No medications on file     Tomasita Crumble, MD 04/30/16 [phone]

## 2016-08-14 ENCOUNTER — Emergency Department (HOSPITAL_BASED_OUTPATIENT_CLINIC_OR_DEPARTMENT_OTHER)
Admission: EM | Admit: 2016-08-14 | Discharge: 2016-08-14 | Disposition: A | Discharge status: Home / Self Care | Payer: BLUE CROSS/BLUE SHIELD | Attending: Emergency Medicine | Admitting: Emergency Medicine

## 2016-08-14 ENCOUNTER — Emergency Department (HOSPITAL_BASED_OUTPATIENT_CLINIC_OR_DEPARTMENT_OTHER): Payer: BLUE CROSS/BLUE SHIELD

## 2016-08-14 ENCOUNTER — Encounter (HOSPITAL_BASED_OUTPATIENT_CLINIC_OR_DEPARTMENT_OTHER): Payer: Self-pay | Admitting: Emergency Medicine

## 2016-08-14 DIAGNOSIS — F17210 Nicotine dependence, cigarettes, uncomplicated: Secondary | ICD-10-CM | POA: Insufficient documentation

## 2016-08-14 DIAGNOSIS — R1011 Right upper quadrant pain: Secondary | ICD-10-CM | POA: Insufficient documentation

## 2016-08-14 DIAGNOSIS — Z79899 Other long term (current) drug therapy: Secondary | ICD-10-CM | POA: Insufficient documentation

## 2016-08-14 DIAGNOSIS — R112 Nausea with vomiting, unspecified: Secondary | ICD-10-CM | POA: Insufficient documentation

## 2016-08-14 LAB — URINALYSIS, ROUTINE W REFLEX MICROSCOPIC
Bilirubin Urine: NEGATIVE
Glucose, UA: NEGATIVE mg/dL
Ketones, ur: NEGATIVE mg/dL
Leukocytes, UA: NEGATIVE
Nitrite: NEGATIVE
Protein, ur: NEGATIVE mg/dL
Specific Gravity, Urine: 1.003 — ABNORMAL LOW (ref 1.005–1.030)
pH: 7 (ref 5.0–8.0)

## 2016-08-14 LAB — CBC WITH DIFFERENTIAL/PLATELET
BASOS ABS: 0 10*3/uL (ref 0.0–0.1)
BASOS PCT: 1 %
EOS ABS: 0.1 10*3/uL (ref 0.0–0.7)
Eosinophils Relative: 2 %
HEMATOCRIT: 35.4 % — AB (ref 36.0–46.0)
HEMOGLOBIN: 11.7 g/dL — AB (ref 12.0–15.0)
Lymphocytes Relative: 39 %
Lymphs Abs: 1.5 10*3/uL (ref 0.7–4.0)
MCH: 28.3 pg (ref 26.0–34.0)
MCHC: 33.1 g/dL (ref 30.0–36.0)
MCV: 85.7 fL (ref 78.0–100.0)
Monocytes Absolute: 0.3 10*3/uL (ref 0.1–1.0)
Monocytes Relative: 7 %
NEUTROS ABS: 2 10*3/uL (ref 1.7–7.7)
NEUTROS PCT: 51 %
Platelets: 205 10*3/uL (ref 150–400)
RBC: 4.13 MIL/uL (ref 3.87–5.11)
RDW: 12 % (ref 11.5–15.5)
WBC: 3.9 10*3/uL — ABNORMAL LOW (ref 4.0–10.5)

## 2016-08-14 LAB — COMPREHENSIVE METABOLIC PANEL
ALBUMIN: 4.3 g/dL (ref 3.5–5.0)
ALK PHOS: 44 U/L (ref 38–126)
ALT: 16 U/L (ref 14–54)
ANION GAP: 7 (ref 5–15)
AST: 20 U/L (ref 15–41)
BILIRUBIN TOTAL: 0.4 mg/dL (ref 0.3–1.2)
BUN: 14 mg/dL (ref 6–20)
CO2: 25 mmol/L (ref 22–32)
Calcium: 9.2 mg/dL (ref 8.9–10.3)
Chloride: 106 mmol/L (ref 101–111)
Creatinine, Ser: 1.08 mg/dL — ABNORMAL HIGH (ref 0.44–1.00)
GFR calc Af Amer: >60 mL/min (ref >60)
GFR calc non Af Amer: >60 mL/min (ref >60)
GLUCOSE: 110 mg/dL — AB (ref 65–99)
POTASSIUM: 3.8 mmol/L (ref 3.5–5.1)
SODIUM: 138 mmol/L (ref 135–145)
TOTAL PROTEIN: 7 g/dL (ref 6.5–8.1)

## 2016-08-14 LAB — URINE MICROSCOPIC-ADD ON
BACTERIA UA: NONE SEEN
WBC UA: NONE SEEN WBC/hpf (ref 0–5)

## 2016-08-14 LAB — PREGNANCY, URINE: Preg Test, Ur: NEGATIVE

## 2016-08-14 LAB — LIPASE, BLOOD: Lipase: 16 U/L (ref 11–51)

## 2016-08-14 MED ORDER — SUCRALFATE 1 G PO TABS: 1.0000 g | ORAL_TABLET | Freq: Three times a day (TID) | ORAL | 0 refills | Status: DC

## 2016-08-14 MED ORDER — OMEPRAZOLE 20 MG PO CPDR: 20.0000 mg | DELAYED_RELEASE_CAPSULE | Freq: Two times a day (BID) | ORAL | 0 refills | Status: DC

## 2016-08-14 NOTE — ED Notes (Signed)
Pt at ultrasound

## 2016-08-14 NOTE — ED Notes (Signed)
ED PA at bedside

## 2016-08-14 NOTE — ED Triage Notes (Signed)
Patient reports right upper quadrant abdominal pain which radiates to her back.  States this began Wednesday.  Reports nausea, and 1 episode of vomiting.  Denies diarrhea.

## 2016-08-14 NOTE — ED Provider Notes (Signed)
[facility] Provider Note   CSN: [id number] Arrival date & time: 08/14/16  1625  By signing my name below, I, Rosario Adie, attest that this documentation has been prepared under the direction and in the presence of Fayrene Helper, PA-C.  Electronically Signed: Rosario Adie, ED Scribe. 08/14/16. 5:06 PM.  History   Chief Complaint Chief Complaint  Patient presents with  . Abdominal Pain   The history is provided by the patient. No language interpreter was used.    HPI Comments: Gwendolyn Padilla is a 40 y.o. female with no pertinent PMHx, who presents to the Emergency Department complaining of gradually worsening, intermittent RUQ abdominal pain onset approximately 2 days ago. Her pain will sometimes unilaterally radiate into her right-sided back. Pt describes her pain as piercing and pressure-like, and she rates it as 8/10. She reports associated nausea, vomiting x 1 episode, urgency, and fatigue secondary to her abdominal pain. She also notes that her stools have been very light in color recently. Pt notes that her pain is exacerbated post-prandially. No treatments were tried prior to coming into the ED. Pt has a prior PSHx to the abdomen including and ectopic pregnancy surgery, but otherwise no pertinent surgical history. Pt drinks alcohol in moderate amounts 2-3 times a week. No h/o DM, however, she has a paternal FHx of DM. She is currently having a menstrual cycle. She denies fever, diarrhea, or any other associated symptoms.   Past Medical History:  Diagnosis Date  . Ectopic pregnancy, tubal   . Palpitation   . Racing heart beat    avoids caffine/chocolate   Patient Active Problem List   Diagnosis Date Noted  . Low back pain 02/02/2015  . Palpitations 08/21/2012   Past Surgical History:  Procedure Laterality Date  . ECTOPIC PREGNANCY SURGERY     2  . FOOT SURGERY      l foot   . FOOT SURGERY     Left   OB History    Gravida Para Term Preterm AB  Living   0 0 0 0 0     SAB TAB Ectopic Multiple Live Births   0 0 0         Home Medications    Prior to Admission medications   Medication Sig Start Date End Date Taking? Authorizing Provider  Ascorbic Acid (VITAMIN C) 500 MG CHEW Chew by mouth.    Historical Provider, MD  Cyanocobalamin (VITAMIN B12 PO) Take by mouth.    Historical Provider, MD  HYDROcodone-acetaminophen (NORCO/VICODIN) 5-325 MG per tablet 1-2 tablets po q 6 hours prn moderate to severe pain 07/14/12   Quita Skye, MD  ibuprofen (ADVIL,MOTRIN) 600 MG tablet Take 1 tablet (600 mg total) by mouth 3 (three) times daily. 01/23/15   Gerhard Munch, MD  ibuprofen (ADVIL,MOTRIN) 800 MG tablet Take 1 tablet (800 mg total) by mouth 3 (three) times daily. 07/14/12   Quita Skye, MD  Multiple Vitamins-Minerals (MULTIVITAMIN WITH MINERALS) tablet Take 1 tablet by mouth daily.    Historical Provider, MD  omeprazole (PRILOSEC) 20 MG capsule Take 1 capsule (20 mg total) by mouth 2 (two) times daily before a meal. 08/14/16   Fayrene Helper, PA-C  Prenatal Vit-Fe Fumarate-FA (PRENATAL VITAMINS PLUS) 27-1 MG TABS Take by mouth.    Historical Provider, MD  sucralfate (CARAFATE) 1 g tablet Take 1 tablet (1 g total) by mouth 4 (four) times daily -  with meals and at bedtime. 08/14/16   Fayrene Helper, PA-C  traMADol (ULTRAM) 50 MG tablet Take 1 tablet (50 mg total) by mouth every 6 (six) hours as needed (breakthrough pain). 01/23/15   Gerhard Munchobert Lockwood, MD  VITAMIN B1-B12 PO Take 500 Units by mouth.    Historical Provider, MD   Family History Family History  Problem Relation Age of Onset  . CAD Father 2150  . Heart disease Mother 7140    Palpitations and murmur   Social History Social History  Substance Use Topics  . Smoking status: Current Some Day Smoker    Types: Cigarettes  . Smokeless tobacco: Never Used     Comment: Quit tobacco 04  . Alcohol use Yes     Comment: 1 drink/day   Allergies   Vicodin [hydrocodone-acetaminophen]  Review  of Systems Review of Systems  Constitutional: Positive for fatigue. Negative for fever.  Gastrointestinal: Positive for abdominal pain, nausea and vomiting. Negative for diarrhea.  Genitourinary: Positive for urgency.  All other systems reviewed and are negative.  Physical Exam Updated Vital Signs BP 117/84 (BP Location: Left Arm)   Pulse 81   Temp 98.1 F (36.7 C) (Oral)   Resp 18   Ht 5\' 5"  (1.651 m)   Wt 158 lb (71.7 kg)   LMP 08/09/2016   SpO2 100%   BMI 26.29 kg/m   Physical Exam  Constitutional: She appears well-developed and well-nourished. No distress.  HENT:  Head: Normocephalic and atraumatic.  Eyes: Conjunctivae are normal.  Neck: Normal range of motion.  Cardiovascular: Normal rate, regular rhythm and normal heart sounds.   No murmur heard. Pulmonary/Chest: Effort normal and breath sounds normal. No respiratory distress. She has no wheezes. She has no rales.  Abdominal: Soft. She exhibits no distension. There is tenderness (RUQ). There is no rebound, no guarding, no CVA tenderness, no tenderness at McBurney's point and negative Murphy's sign.  Musculoskeletal: Normal range of motion.  Neurological: She is alert.  Skin: No pallor.  Psychiatric: She has a normal mood and affect. Her behavior is normal.  Nursing note and vitals reviewed.  ED Treatments / Results  DIAGNOSTIC STUDIES: Oxygen Saturation is 100% on RA, normal by my interpretation.   COORDINATION OF CARE: 4:50 PM-Discussed next steps with pt. Pt verbalized understanding and is agreeable with the plan.   Labs (all labs ordered are listed, but only abnormal results are displayed) Labs Reviewed  CBC WITH DIFFERENTIAL/PLATELET - Abnormal; Notable for the following:       Result Value   WBC 3.9 (*)    Hemoglobin 11.7 (*)    HCT 35.4 (*)    All other components within normal limits  COMPREHENSIVE METABOLIC PANEL - Abnormal; Notable for the following:    Glucose, Bld 110 (*)    Creatinine, Ser  1.08 (*)    All other components within normal limits  URINALYSIS, ROUTINE W REFLEX MICROSCOPIC (NOT AT [facility]) - Abnormal; Notable for the following:    Specific Gravity, Urine 1.003 (*)    Hgb urine dipstick SMALL (*)    All other components within normal limits  URINE MICROSCOPIC-ADD ON - Abnormal; Notable for the following:    Squamous Epithelial / LPF 0-5 (*)    All other components within normal limits  LIPASE, BLOOD  PREGNANCY, URINE   Radiology [place] Abdomen Limited  Result Date: 08/14/2016 CLINICAL DATA:  Right upper quadrant pain EXAM: US ABDOMEN LIMITED - RIGHT UPPER QUADRANT COMPARISON:  CT abdomen pelvis March 12 2005 FINDINGS: Gallbladder: Gallbladder is contracted consistent with post-prandial state. No gallstones  are evident. There is no pericholecystic fluid. No sonographic Murphy sign noted by sonographer. Common bile duct: Diameter: 4 mm. No intrahepatic or extrahepatic biliary duct dilatation. Liver: No focal lesion identified. Within normal limits in parenchymal echogenicity. IMPRESSION: Gallbladder is contracted consistent with post-prandial state. No gallstones or pericholecystic fluid evident. Given the degree of gallbladder contraction, small gallstones could be obscured. If there remains concern for gallbladder pathology, repeat study after minimum of 8 hours fasting would be advisable. Study otherwise unremarkable. Electronically Signed   By: Bretta BangWilliam  Woodruff III M.D.   On: 08/14/2016 18:03   Procedures Procedures   Medications Ordered in ED Medications - No data to display  Initial Impression / Assessment and Plan / ED Course  I have reviewed the triage vital signs and the nursing notes.  Pertinent labs & imaging results that were available during my care of the patient were reviewed by me and considered in my medical decision making (see chart for details).  Clinical Course    Patient is a 40yo female presents with abdominal pain x 2 days. On exam, nontoxic,  nonseptic appearing, in no   apparent distress. Patient's pain and other symptoms adequately managed in emergency department. Labs, imaging and vitals reviewed. Patient does not meet the SIRS or Sepsis criteria. US and labs while in the ED are otherwise unremarkable. No indication of appendicitis, bowel obstruction, bowel perforation, cholecystitis, diverticulitis, PID or ectopic pregnancy. Patient discharged home with symptomatic treatment including rx's for Prilosec and Carafate. I have discussed reasons to return immediately to the ER.  Patient expresses understanding and agrees with plan. All questions were answered prior to dispo.   Final Clinical Impressions(s) / ED Diagnoses   Final diagnoses:  Postprandial RUQ pain   New Prescriptions New Prescriptions   OMEPRAZOLE (PRILOSEC) 20 MG CAPSULE    Take 1 capsule (20 mg total) by mouth 2 (two) times daily before a meal.   SUCRALFATE (CARAFATE) 1 G TABLET    Take 1 tablet (1 g total) by mouth 4 (four) times daily -  with meals and at bedtime.   I personally performed the services described in this documentation, which was scribed in my presence. The recorded information has been reviewed and is accurate.       Fayrene HelperBowie Sharmin Foulk, PA-C 08/14/16 Rickey [id number]    Alvira MondayErin Schlossman, MD 08/15/16 2120

## 2016-11-08 ENCOUNTER — Emergency Department (HOSPITAL_BASED_OUTPATIENT_CLINIC_OR_DEPARTMENT_OTHER)
Admission: EM | Admit: 2016-11-08 | Discharge: 2016-11-08 | Disposition: A | Discharge status: Home / Self Care | Payer: BLUE CROSS/BLUE SHIELD | Attending: Emergency Medicine | Admitting: Emergency Medicine

## 2016-11-08 ENCOUNTER — Encounter (HOSPITAL_BASED_OUTPATIENT_CLINIC_OR_DEPARTMENT_OTHER): Payer: Self-pay | Admitting: *Deleted

## 2016-11-08 DIAGNOSIS — J0100 Acute maxillary sinusitis, unspecified: Secondary | ICD-10-CM | POA: Insufficient documentation

## 2016-11-08 DIAGNOSIS — Z79899 Other long term (current) drug therapy: Secondary | ICD-10-CM | POA: Diagnosis not present

## 2016-11-08 DIAGNOSIS — F17210 Nicotine dependence, cigarettes, uncomplicated: Secondary | ICD-10-CM | POA: Diagnosis not present

## 2016-11-08 DIAGNOSIS — J320 Chronic maxillary sinusitis: Secondary | ICD-10-CM

## 2016-11-08 DIAGNOSIS — R509 Fever, unspecified: Secondary | ICD-10-CM | POA: Diagnosis present

## 2016-11-08 MED ORDER — AMOXICILLIN-POT CLAVULANATE 875-125 MG PO TABS: 1.0000 | ORAL_TABLET | Freq: Two times a day (BID) | ORAL | 0 refills | Status: DC

## 2016-11-08 MED ORDER — FLUTICASONE PROPIONATE 50 MCG/ACT NA SUSP: 2.0000 | Freq: Every day | NASAL | 0 refills | Status: DC

## 2016-11-08 MED ORDER — IBUPROFEN 400 MG PO TABS
400.0000 mg | ORAL_TABLET | Freq: Once | ORAL | Status: AC
  Administered 2016-11-08: 400 mg via ORAL
  Filled 2016-11-08: qty 1

## 2016-11-08 MED ORDER — FLUCONAZOLE 150 MG PO TABS: 150.0000 mg | ORAL_TABLET | Freq: Once | ORAL | 0 refills | Status: DC | PRN

## 2016-11-08 MED FILL — AMOX-CLAV 875-125 MG TABLET: 875-125 | 7 days supply | Qty: 14 | Fill #0

## 2016-11-08 MED FILL — FLUCONAZOLE 150 MG TABLET: 150 | 1 days supply | Qty: 1 | Fill #0

## 2016-11-08 MED FILL — FLUTICASONE PROP 50 MCG SPR: 50 | 30 days supply | Qty: 16 | Fill #0

## 2016-11-08 NOTE — ED Provider Notes (Signed)
[facility]   CSN: [id number] Arrival date & time: 11/08/16  1053     History   Chief Complaint Chief Complaint  Patient presents with  . Fever    HPI Gwendolyn Padilla is a 41 y.o. female.  HPI   Pt presents with 5 days of nasal congestion, rhinorrhea, postnasal drip, facial pressure, subjective fevers.  Has been using mucinex, vitamin C and vitamins without improvement.  She works with toddlers, has frequent exposures but rarely gets sick.  Denies sore throat, cough, dental pain, ear pain.    Past Medical History:  Diagnosis Date  . Ectopic pregnancy, tubal   . Palpitation   . Racing heart beat    avoids caffine/chocolate    Patient Active Problem List   Diagnosis Date Noted  . Low back pain 02/02/2015  . Palpitations 08/21/2012    Past Surgical History:  Procedure Laterality Date  . ECTOPIC PREGNANCY SURGERY     2  . FOOT SURGERY      l foot   . FOOT SURGERY     Left    OB History    Gravida Para Term Preterm AB Living   0 0 0 0 0     SAB TAB Ectopic Multiple Live Births   0 0 0           Home Medications    Prior to Admission medications   Medication Sig Start Date End Date Taking? Authorizing Provider  Multiple Vitamins-Minerals (MULTIVITAMIN WITH MINERALS) tablet Take 1 tablet by mouth daily.   Yes Historical Provider, MD  amoxicillin-clavulanate (AUGMENTIN) 875-125 MG tablet Take 1 tablet by mouth 2 (two) times daily. One po bid x 7 days 11/08/16   Trixie Dredge, PA-C  Ascorbic Acid (VITAMIN C) 500 MG CHEW Chew by mouth.    Historical Provider, MD  Cyanocobalamin (VITAMIN B12 PO) Take by mouth.    Historical Provider, MD  fluticasone (FLONASE) 50 MCG/ACT nasal spray Place 2 sprays into both nostrils daily. 11/08/16   Trixie Dredge, PA-C  HYDROcodone-acetaminophen (NORCO/VICODIN) 5-325 MG per tablet 1-2 tablets po q 6 hours prn moderate to severe pain 07/14/12   Quita Skye, MD  ibuprofen (ADVIL,MOTRIN) 600 MG tablet Take 1 tablet  (600 mg total) by mouth 3 (three) times daily. 01/23/15   Gerhard Munch, MD  ibuprofen (ADVIL,MOTRIN) 800 MG tablet Take 1 tablet (800 mg total) by mouth 3 (three) times daily. 07/14/12   Quita Skye, MD  omeprazole (PRILOSEC) 20 MG capsule Take 1 capsule (20 mg total) by mouth 2 (two) times daily before a meal. 08/14/16   Fayrene Helper, PA-C  Prenatal Vit-Fe Fumarate-FA (PRENATAL VITAMINS PLUS) 27-1 MG TABS Take by mouth.    Historical Provider, MD  sucralfate (CARAFATE) 1 g tablet Take 1 tablet (1 g total) by mouth 4 (four) times daily -  with meals and at bedtime. 08/14/16   Fayrene Helper, PA-C  traMADol (ULTRAM) 50 MG tablet Take 1 tablet (50 mg total) by mouth every 6 (six) hours as needed (breakthrough pain). 01/23/15   Gerhard Munch, MD  VITAMIN B1-B12 PO Take 500 Units by mouth.    Historical Provider, MD    Family History Family History  Problem Relation Age of Onset  . CAD Father 66  . Heart disease Mother 54    Palpitations and murmur    Social History Social History  Substance Use Topics  . Smoking status: Current Some Day Smoker    Types: Cigarettes  .  Smokeless tobacco: Never Used     Comment: Quit tobacco 04  . Alcohol use Yes     Comment: 1 drink/day     Allergies   Vicodin [hydrocodone-acetaminophen]   Review of Systems Review of Systems  Constitutional: Negative for chills and fever.  HENT: Positive for postnasal drip, rhinorrhea, sinus pain and sinus pressure. Negative for congestion, dental problem, ear pain, mouth sores, sore throat and trouble swallowing.   Eyes: Negative for discharge.  Respiratory: Negative for cough, shortness of breath, wheezing and stridor.   Cardiovascular: Negative for chest pain.  Musculoskeletal: Negative for neck pain and neck stiffness.  Allergic/Immunologic: Negative for immunocompromised state.     Physical Exam Updated Vital Signs BP 114/80   Pulse (!) 52   Temp 98.2 F (36.8 C) (Oral)   Resp 20   Ht 5\' 5"  (1.651 m)    Wt 69.4 kg   LMP 10/31/2016   SpO2 100%   BMI 25.46 kg/m   Physical Exam  Constitutional: She appears well-developed and well-nourished. No distress.  HENT:  Head: Normocephalic and atraumatic.  Nose: Mucosal edema and rhinorrhea present. No epistaxis.  No foreign bodies.  Mouth/Throat: Uvula is midline, oropharynx is clear and moist and mucous membranes are normal. No oropharyngeal exudate, posterior oropharyngeal edema, posterior oropharyngeal erythema or tonsillar abscesses.  Bilateral nasal mucosa edematous, erythematous.  Maxillary sinus tenderness, particularly on the left, with overlying mild facial edema.    No dental abnormality noted. Bilateral TMs normal.   Oropharynx clear.   Eyes: Conjunctivae are normal.  Neck: Normal range of motion. Neck supple.  Cardiovascular: Normal rate and regular rhythm.   Pulmonary/Chest: Effort normal and breath sounds normal. No stridor. No respiratory distress. She has no wheezes. She has no rales.  Lymphadenopathy:    She has no cervical adenopathy.  Neurological: She is alert.  Skin: She is not diaphoretic.  Nursing note and vitals reviewed.    ED Treatments / Results  Labs (all labs ordered are listed, but only abnormal results are displayed) Labs Reviewed - No data to display  EKG  EKG Interpretation None       Radiology No results found.  Procedures Procedures (including critical care time)  Medications Ordered in ED Medications  ibuprofen (ADVIL,MOTRIN) tablet 400 mg (400 mg Oral Given 11/08/16 1109)     Initial Impression / Assessment and Plan / ED Course  I have reviewed the triage vital signs and the nursing notes.  Pertinent labs & imaging results that were available during my care of the patient were reviewed by me and considered in my medical decision making (see chart for details).    Afebrile nontoxic patient 5 days of worsening facial pressure and nasal symptoms with left maxillary sinus tenderness  and swelling.  D/C home with augmentin, flonase, fluconazole per pt request for post-abx yeast infection.  Discussed result, findings, treatment, and follow up  with patient.  Pt given return precautions.  Pt verbalizes understanding and agrees with plan.      Final Clinical Impressions(s) / ED Diagnoses   Final diagnoses:  Left maxillary sinusitis    New Prescriptions New Prescriptions   AMOXICILLIN-CLAVULANATE (AUGMENTIN) 875-125 MG TABLET    Take 1 tablet by mouth 2 (two) times daily. One po bid x 7 days   FLUTICASONE (FLONASE) 50 MCG/ACT NASAL SPRAY    Place 2 sprays into both nostrils daily.     Trixie Dredgemily Rowe Warman, PA-C 11/08/16 1652    Canary Brimhristopher J Tegeler, MD 11/08/16  2019  

## 2016-11-08 NOTE — ED Notes (Signed)
ED Provider at bedside. 

## 2016-11-08 NOTE — ED Notes (Signed)
Pt reports a lot of sneezing, watery eyes and congestion. Denies sore throat.

## 2016-11-08 NOTE — Discharge Instructions (Signed)
Read the information below.  Use the prescribed medication as directed.  Please discuss all new medications with your pharmacist.  You may return to the Emergency Department at any time for worsening condition or any new symptoms that concern you.    °

## 2016-11-08 NOTE — ED Triage Notes (Signed)
Fever, stuffy nose, sneezing, no energy. She has not treated the fever.

## 2017-04-04 ENCOUNTER — Emergency Department (HOSPITAL_BASED_OUTPATIENT_CLINIC_OR_DEPARTMENT_OTHER)
Admission: EM | Admit: 2017-04-04 | Discharge: 2017-04-04 | Disposition: A | Discharge status: Home / Self Care | Payer: BLUE CROSS/BLUE SHIELD | Attending: Emergency Medicine | Admitting: Emergency Medicine

## 2017-04-04 ENCOUNTER — Encounter (HOSPITAL_BASED_OUTPATIENT_CLINIC_OR_DEPARTMENT_OTHER): Payer: Self-pay

## 2017-04-04 DIAGNOSIS — Z87891 Personal history of nicotine dependence: Secondary | ICD-10-CM | POA: Insufficient documentation

## 2017-04-04 DIAGNOSIS — R21 Rash and other nonspecific skin eruption: Secondary | ICD-10-CM | POA: Diagnosis present

## 2017-04-04 DIAGNOSIS — B09 Unspecified viral infection characterized by skin and mucous membrane lesions: Secondary | ICD-10-CM | POA: Diagnosis not present

## 2017-04-04 NOTE — ED Provider Notes (Signed)
[facility]   CSN: [id number] Arrival date & time: 04/04/17  1619  By signing my name below, I, Cynda Acres, attest that this documentation has been prepared under the direction and in the presence of [employer], PA-C. Electronically Signed: Cynda Acres, Scribe. 04/04/17. 5:34 PM.  History   Chief Complaint Chief Complaint  Patient presents with  . Rash    HPI Comments: Gwendolyn Padilla is a 41 y.o. female with no pertinent past medical history, who presents to the Emergency Department complaining of persistent, gradually worsening rash to the right hand and left foot, which appeared this morning. 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 Patient reports an associated low-grade fever of 99.5, and itching upon palpation. No medications taken prior to arrival. 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 Patient denies any outside exposure, tick bites, new detergents, shampoos, new clothes, animals, or any history of syphilis. Patient denies any mouth pain, chills, nausea, vomiting, or any additional symptoms.   The history is provided by the patient. No language interpreter was used.    Past Medical History:  Diagnosis Date  . Ectopic pregnancy, tubal   . Palpitation   . Racing heart beat    avoids caffine/chocolate    Patient Active Problem List   Diagnosis Date Noted  . Low back pain 02/02/2015  . Palpitations 08/21/2012    Past Surgical History:  Procedure Laterality Date  . ECTOPIC PREGNANCY SURGERY     2  . FOOT SURGERY      l foot   . FOOT SURGERY     Left    OB History    Gravida Para Term Preterm AB Living   0 0 0 0 0     SAB TAB Ectopic Multiple Live Births   0 0 0           Home Medications    Prior to Admission medications   Not on File    Family History Family History  Problem Relation Age of Onset  . CAD Father 44    . Heart disease Mother 32       Palpitations and murmur    Social History Social History  Substance Use Topics  . Smoking status: Former Games developer  . Smokeless tobacco: Never Used  . Alcohol use Yes     Allergies   Vicodin [hydrocodone-acetaminophen]   Review of Systems Review of Systems  Constitutional: Positive for fever. Negative for chills.  Gastrointestinal: Negative for nausea and vomiting.  Musculoskeletal: Positive for arthralgias (right arm).  Skin: Positive for rash (right hand, left foot).     Physical Exam Updated Vital Signs BP 118/79 (BP Location: Left Arm)   Pulse (!) 52   Temp 98.5 F (36.9 C) (Oral)   Resp 18   Ht 5\' 5"  (1.651 m)   Wt 68.9 kg (152 lb)   LMP 03/25/2017   SpO2 99%   BMI 25.29 kg/m   Physical Exam  Constitutional: She appears well-developed and well-nourished.  Non-toxic appearing.   HENT:  Head: Normocephalic and atraumatic.  Right Ear: External ear normal.  Left Ear: External ear normal.  Mouth/Throat: Uvula is midline, oropharynx is clear and moist and mucous membranes are normal.  Without lesions  Eyes: Conjunctivae are normal. Right eye exhibits no discharge. Left eye exhibits no discharge. No scleral icterus.  Pulmonary/Chest: Effort normal. No respiratory distress.  Neurological: She is alert.  Skin: Skin is warm and dry. Rash noted. No pallor.  Scarce macules on the right dorsal surface of the hand. One on the palm of the hand. There is scarce amount, between the 3rd and 4th toes of the left foot. No burrows. No surrounding erythema. No fluctuance.   Psychiatric: She has a normal mood and affect.  Nursing note and vitals reviewed.    ED Treatments / Results  DIAGNOSTIC STUDIES: Oxygen Saturation is 99% on RA, normal by my interpretation.    COORDINATION OF CARE: 5:34 PM Discussed treatment plan with pt at bedside and pt agreed to plan.  Labs (all labs ordered are listed, but only abnormal results are  displayed) Labs Reviewed - No data to display  EKG  EKG Interpretation None       Radiology No results found.  Procedures Procedures (including critical care time)  Medications Ordered in ED Medications - No data to display   Initial Impression / Assessment and Plan / ED Course  I have reviewed the triage vital signs and the nursing notes.  Pertinent labs & imaging results that were available during my care of the patient were reviewed by me and considered in my medical decision making (see chart for details).   41 year old presenting with a viral rash after exposure from daycare. Discussed that this is self-limited. No signs of secondary infection. Discharged with symptomatic treatment. Follow up with PCP in 2-3 days. Return precautions discussed. Patient is safe for discharge at this time.  Patient case seen and discussed with Dr. Donnald GarrePfeiffer who is in agreement with plan.    Final Clinical Impressions(s) / ED Diagnoses   Final diagnoses:  Viral exanthem    New Prescriptions There are no discharge medications for this patient.  I personally performed the services described in this documentation, which was scribed in my presence. The recorded information has been reviewed and is accurate.    Princella PellegriniMaczis, Michael M, PA-C 04/05/17 Joan [id number]    Arby BarrettePfeiffer, Marcy, MD 04/12/17 2114

## 2017-04-04 NOTE — ED Triage Notes (Signed)
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

## 2017-04-04 NOTE — Discharge Instructions (Signed)
I am writing you out of work for a few days. Please limit contact exposure during the next few days. You can apply benadryl cream for itching. For fever you can take ibuprofen or tylenol. Please follow up with you PCP in the next few days. If you do not have a PCP you can follow up at the wellness center or use the resource provided below. If you develop worsening or new concerning symptoms you can return to the emergency department for re-evaluation.   Establish relationship with primary care doctor as discussed. A resource guide and information on the Affordable Care Act has been provided for your information.    RESOURCE GUIDE  If you do not have a primary care doctor to follow up with regarding today's visit, please call the [facility] at [phone] to make an appointment. Hours of operation are 10am - 7pm, Monday through Friday, and they have a sliding scale fee.   Insufficient Money for Medicine: Contact [employer] Way:  call "211" or [facility] [phone].  No Primary Care Doctor: Call [facility]  [phone] - can help you locate a primary care doctor that  accepts your insurance, provides certain services, etc. Physician Referral Service- [phone]  Agencies that provide inexpensive medical care: [facility]  [phone] [facility]  [phone] [facility]  [phone] [facility]  [phone] [facility]  [phone] [facility]  [phone]  Medicaid-accepting [facility] Providers: [facility]- [address]  [phone], Mon-Fri 9am-7pm, Sat 9am-1pm [facility]- [address] [place], [address] [place]  [phone] [facility]- [address], [address] [place]  [phone] [facility]- [address]  [phone] Renaye Rakers- [address] [place], [address], [phone]  Only accepts [place] patients  after they have their name  applied to their card  Self Pay (no insurance) in [facility]: Sickle Cell Patients: Dr Willey Blade, [facility]  [address] [place], [phone] [facility]- [address] [place]  [phone]       [facility]- [address]       -     [facility]- see information above (Speak to [employer] if you do not have insurance)       -  [facility]- [address] [place], [phone]       -  [facility]- [address],  [phone]       -  [facility]- [address] [phone]       -  Dr Julio Sicks-  [address], [place], [phone]       -  [facility]- [address] [phone]       -  [facility]- [address], [phone], also [address], [phone]       -    [facility]- [address] [place], [phone], 1st & 3rd Saturday   every month, 10am-1pm  1) Find a Doctor and Pay Out of Pocket Although you won't have to find out who is covered by your insurance plan, it is a good idea to ask around and get recommendations. You will then need to call the office and see if the doctor you have  chosen will accept you as a new patient and what types of options they offer for patients who are self-pay. Some doctors offer discounts or will set up payment plans for their patients who do not have insurance, but you will need to ask so you aren't surprised when you get to your appointment.  2) Contact Your Local Health Department Not all health departments have doctors that can see patients for sick visits, but many do, so it is worth a call to see if yours does. If you don't know where your local health department is, you can check in your phone book. The CDC also has a tool to help you locate your state's health department, and many state websites also have listings of all of their local health departments.  3) Find a  Walk-in Clinic If your illness is not likely to be very severe or complicated, you may want to try a walk in clinic. These are popping up all over the country in pharmacies, drugstores, and shopping centers. They're usually staffed by nurse practitioners or physician assistants that have been trained to treat common illnesses and complaints. They're usually fairly quick and inexpensive. However, if you have serious medical issues or chronic medical problems, these are probably not your best option

## 2017-11-13 IMAGING — CR DG ANKLE COMPLETE 3+V*R*
3 series · 3 of 3 positions shown · non-contrast
Comparison: None.

CLINICAL DATA: Rolled ankle today with lateral ankle pain. Initial
encounter.

EXAM:
RIGHT ANKLE - COMPLETE 3+ VIEW

[t ankle joint ap right]
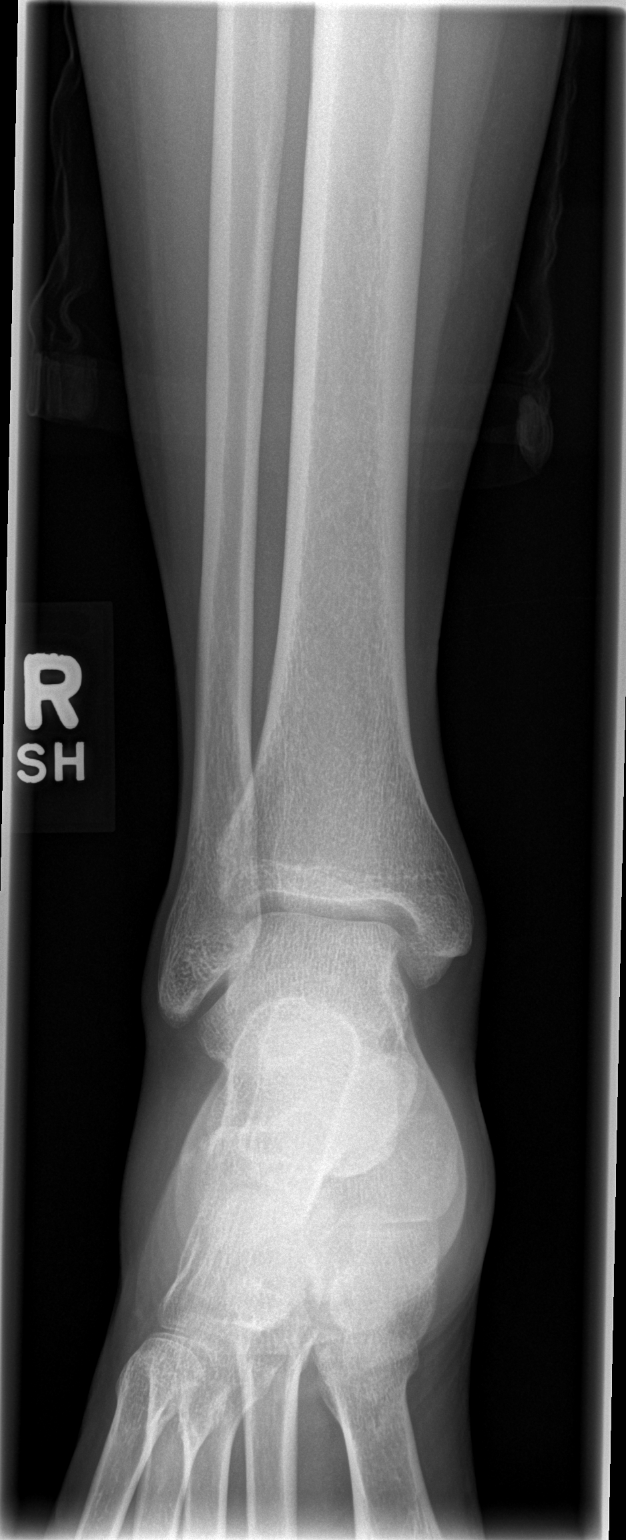

[t ankle joint oblique right]
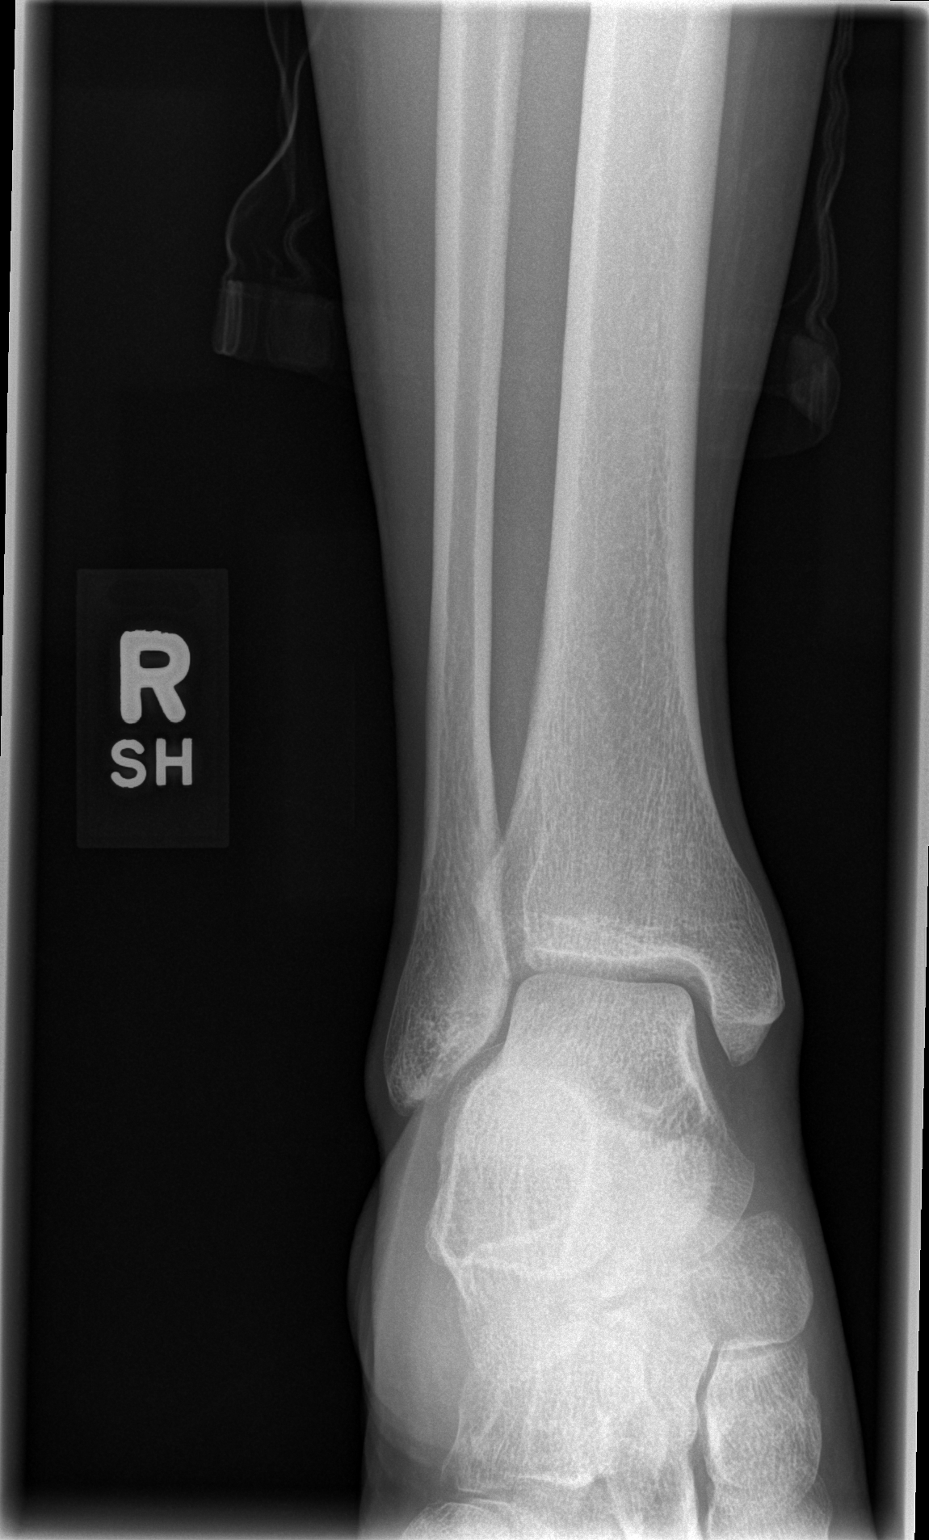

[t ankle joint lat right]
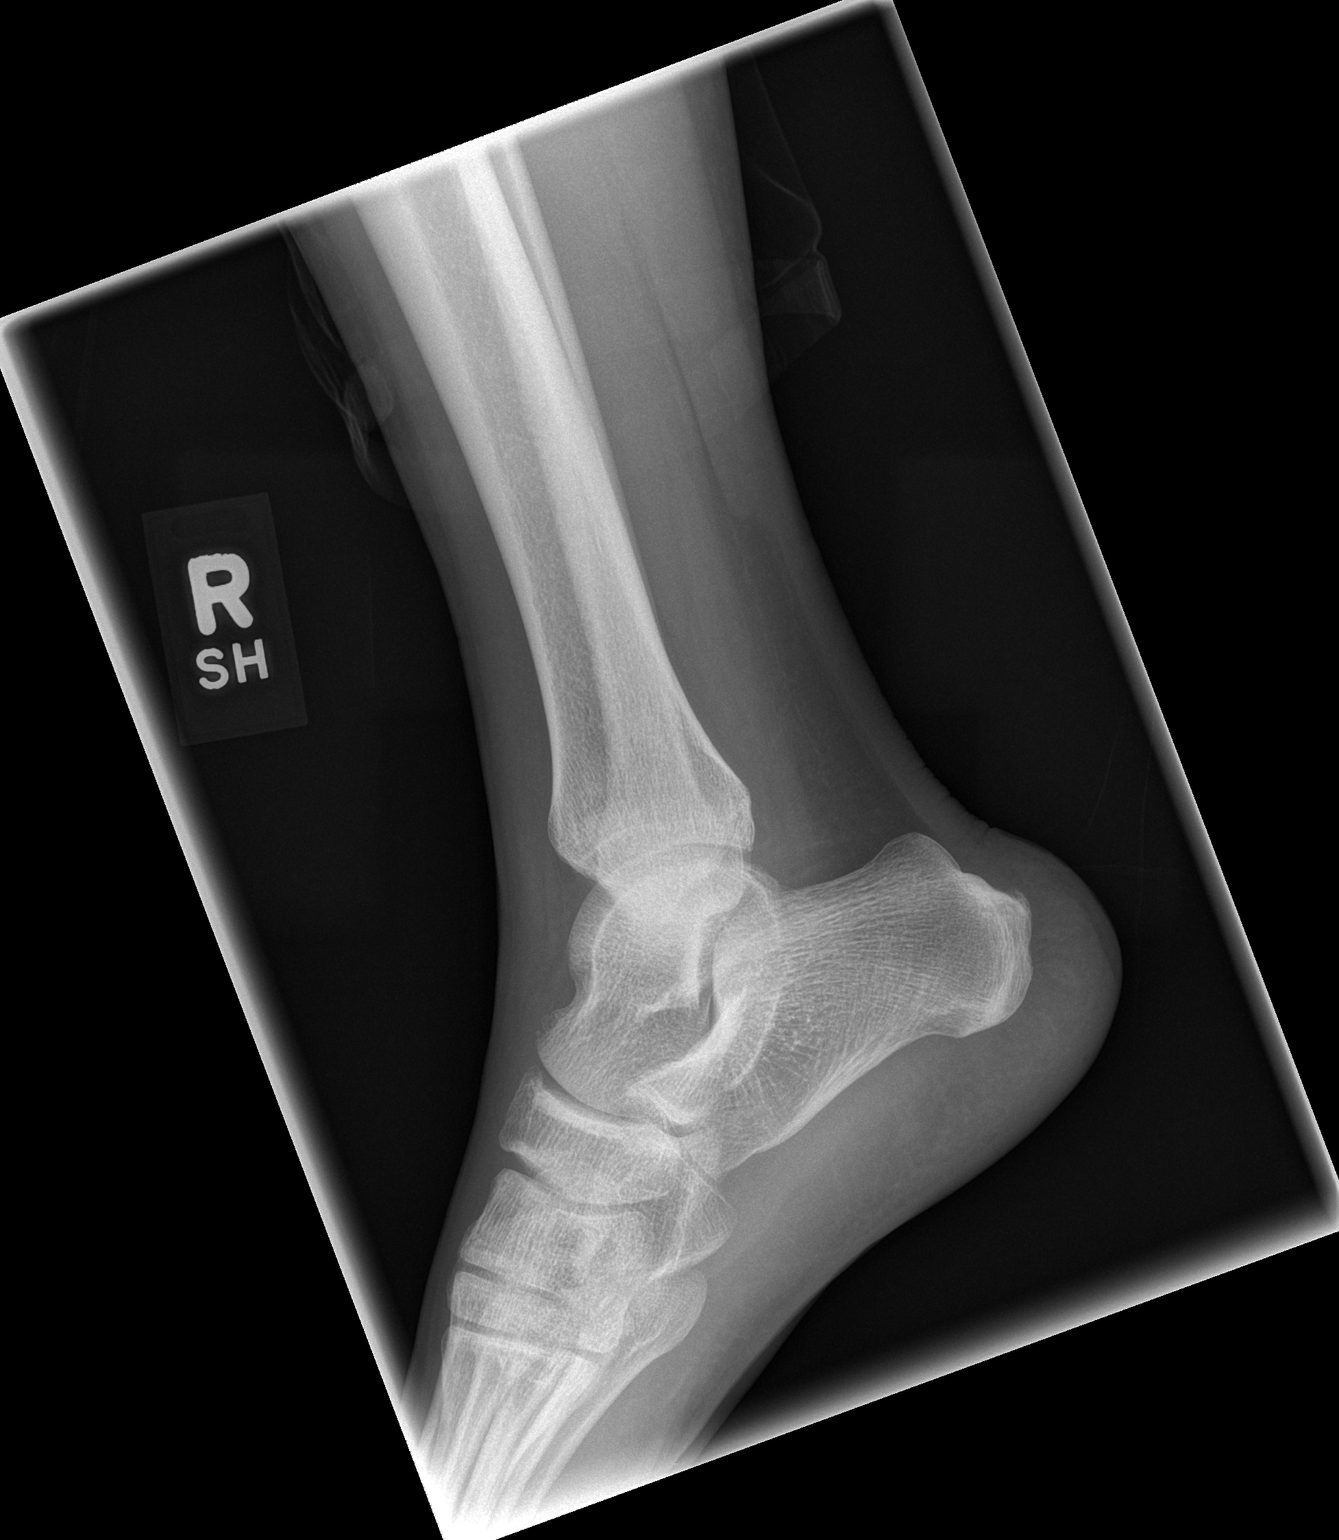

[3 of 3 positions shown; findings below may reference images not displayed]

FINDINGS: There is no evidence of fracture, dislocation, or joint effusion.
There is no evidence of arthropathy or other focal bone abnormality.
Soft tissues are unremarkable.
IMPRESSION: Negative.

## 2018-02-25 ENCOUNTER — Emergency Department (HOSPITAL_BASED_OUTPATIENT_CLINIC_OR_DEPARTMENT_OTHER)
Admission: EM | Admit: 2018-02-25 | Discharge: 2018-02-25 | Disposition: A | Discharge status: Home / Self Care | Payer: BLUE CROSS/BLUE SHIELD | Attending: Emergency Medicine | Admitting: Emergency Medicine

## 2018-02-25 ENCOUNTER — Other Ambulatory Visit: Payer: Self-pay

## 2018-02-25 ENCOUNTER — Encounter (HOSPITAL_BASED_OUTPATIENT_CLINIC_OR_DEPARTMENT_OTHER): Payer: Self-pay | Admitting: Emergency Medicine

## 2018-02-25 DIAGNOSIS — T1592XA Foreign body on external eye, part unspecified, left eye, initial encounter: Secondary | ICD-10-CM | POA: Diagnosis not present

## 2018-02-25 DIAGNOSIS — Y9289 Other specified places as the place of occurrence of the external cause: Secondary | ICD-10-CM | POA: Insufficient documentation

## 2018-02-25 DIAGNOSIS — Z87891 Personal history of nicotine dependence: Secondary | ICD-10-CM | POA: Diagnosis not present

## 2018-02-25 DIAGNOSIS — X58XXXA Exposure to other specified factors, initial encounter: Secondary | ICD-10-CM | POA: Insufficient documentation

## 2018-02-25 DIAGNOSIS — Y9389 Activity, other specified: Secondary | ICD-10-CM | POA: Insufficient documentation

## 2018-02-25 DIAGNOSIS — H5712 Ocular pain, left eye: Secondary | ICD-10-CM | POA: Insufficient documentation

## 2018-02-25 DIAGNOSIS — Z79899 Other long term (current) drug therapy: Secondary | ICD-10-CM | POA: Diagnosis not present

## 2018-02-25 DIAGNOSIS — Y999 Unspecified external cause status: Secondary | ICD-10-CM | POA: Diagnosis not present

## 2018-02-25 MED ORDER — SULFACETAMIDE SODIUM 10 % OP SOLN
2.0000 [drp] | OPHTHALMIC | Status: DC
  Administered 2018-02-25: 2 [drp] via OPHTHALMIC
  Filled 2018-02-25: qty 15

## 2018-02-25 MED ORDER — TETRACAINE HCL 0.5 % OP SOLN
2.0000 [drp] | Freq: Once | OPHTHALMIC | Status: DC
  Filled 2018-02-25: qty 4

## 2018-02-25 MED ORDER — FLUORESCEIN SODIUM 1 MG OP STRP
1.0000 | ORAL_STRIP | Freq: Once | OPHTHALMIC | Status: DC
  Filled 2018-02-25: qty 1

## 2018-02-25 NOTE — ED Provider Notes (Signed)
[facility] EMERGENCY DEPARTMENT Provider Note   CSN: [id number] Arrival date & time: 02/25/18  1433     History   Chief Complaint Chief Complaint  Patient presents with  . Eye Problem    HPI Gwendolyn Padilla is a 42 y.o. female with a past medical history of palpitations, who presents today for evaluation of left eye swelling and pain.  She reports that yesterday she was spray painting with letter when she felt something sharp in her eye.  She reports that since then she has had pain in her left eye with swelling and green, goopy drainage.  She reports that she has looked in her eye and has not found anything in the eye.  He does not wear contacts or glasses.  Denies blurry vision, double vision, or other visual changes.  Reports that this is an isolated symptom.  No other concerns today.  HPI  Past Medical History:  Diagnosis Date  . Ectopic pregnancy, tubal   . Palpitation   . Racing heart beat    avoids caffine/chocolate    Patient Active Problem List   Diagnosis Date Noted  . Low back pain 02/02/2015  . Palpitations 08/21/2012    Past Surgical History:  Procedure Laterality Date  . ECTOPIC PREGNANCY SURGERY     2  . FOOT SURGERY      l foot   . FOOT SURGERY     Left     OB History    Gravida  0   Para  0   Term  0   Preterm  0   AB  0   Living        SAB  0   TAB  0   Ectopic  0   Multiple      Live Births               Home Medications    Prior to Admission medications   Medication Sig Start Date End Date Taking? Authorizing Provider  norethindrone-ethinyl estradiol (MICROGESTIN,JUNEL,LOESTRIN) 1-20 MG-MCG tablet Take by mouth. 11/25/16  Yes [provider]  Multiple Vitamin (MULTI-VITAMINS) TABS Take by mouth.    [provider]    Family History Family History  Problem Relation Age of Onset  . CAD Father 39  . Heart disease Mother 23       Palpitations and murmur    Social History Social History     Tobacco Use  . Smoking status: Former Games developer  . Smokeless tobacco: Never Used  Substance Use Topics  . Alcohol use: Yes  . Drug use: No     Allergies   Vicodin [hydrocodone-acetaminophen]   Review of Systems Review of Systems  Constitutional: Negative for chills and fever.  HENT: Negative for ear pain and sore throat.   Eyes: Positive for pain, discharge, redness and itching. Negative for visual disturbance.  Respiratory: Negative for cough and shortness of breath.   Cardiovascular: Negative for chest pain and palpitations.  Gastrointestinal: Negative for abdominal pain and vomiting.  Genitourinary: Negative for dysuria and hematuria.  Musculoskeletal: Negative for arthralgias and back pain.  Skin: Negative for color change and rash.  Neurological: Negative for seizures and syncope.  All other systems reviewed and are negative.    Physical Exam Updated Vital Signs BP 106/67 (BP Location: Right Arm)   Pulse 64   Temp 98.5 F (36.9 C) (Oral)   Resp 16   Ht 5\' 4"  (1.626 m)   Wt 71.7 kg (  158 lb)   LMP 02/19/2018   SpO2 100%   BMI 27.12 kg/m   Physical Exam  Constitutional: She appears well-developed and well-nourished. No distress.  Has glitter on her anterior scalp.  HENT:  Head: Normocephalic and atraumatic.  Mild swelling around left eye upper lid.  Eyes: Pupils are equal, round, and reactive to light. EOM are normal. Right eye exhibits no discharge. Left eye exhibits exudate. Left eye exhibits no discharge. Foreign body present in the left eye. Left conjunctiva is injected. No scleral icterus. Right eye exhibits normal extraocular motion. Left eye exhibits normal extraocular motion.  Slit lamp exam:      The left eye shows no corneal abrasion, no corneal flare, no corneal ulcer, no foreign body, no hyphema, no hypopyon and no fluorescein uptake.  She has full pain-free extraocular range of motion.  When the eyelid was flipped a small object fell out, not  obviously shiny.  Neck: Normal range of motion. Neck supple.  Cardiovascular: Normal rate and regular rhythm.  Pulmonary/Chest: Effort normal. No stridor. No respiratory distress.  Abdominal: She exhibits no distension.  Musculoskeletal: She exhibits no edema or deformity.  Neurological: She is alert. She exhibits normal muscle tone.  Skin: Skin is warm and dry. She is not diaphoretic.  Psychiatric: She has a normal mood and affect. Her behavior is normal.  Nursing note and vitals reviewed.    ED Treatments / Results  Labs (all labs ordered are listed, but only abnormal results are displayed) Labs Reviewed - No data to display  EKG None  Radiology No results found.  Procedures Irrigation Date/Time: 02/25/2018 5:54 PM Performed by: Cristina GongHammond, Marlyn Rabine W, PA-C Authorized by: Cristina GongHammond, Barney Gertsch W, PA-C  Consent: Verbal consent obtained. Consent given by: patient Local anesthesia used: yes Anesthesia: see MAR for details  Anesthesia: Local anesthesia used: yes Comments: Patient left eye irrigated with 100 mL's normal saline.    (including critical care time)  Medications Ordered in ED Medications  fluorescein ophthalmic strip 1 strip (has no administration in time range)  tetracaine (PONTOCAINE) 0.5 % ophthalmic solution 2 drop (has no administration in time range)  sulfacetamide (BLEPH-10) 10 % ophthalmic solution 2 drop (2 drops Left Eye Given 02/25/18 1646)     Initial Impression / Assessment and Plan / ED Course  I have reviewed the triage vital signs and the nursing notes.  Pertinent labs & imaging results that were available during my care of the patient were reviewed by me and considered in my medical decision making (see chart for details).    Patient presents today for evaluation of left eye pain and concern for glitter in her eye.  When her lids were everted a small object was removed, not obviously extremity, however suspected to be because of patient's  foreign body sensation.  She did have thick green eye drainage.  No left eye fluorescein uptake.  She will be treated with sulfacetamide eyedrops.  Ophthalmology follow-up given.  Return precautions were discussed.  Patient discharged home.   Final Clinical Impressions(s) / ED Diagnoses   Final diagnoses:  Left eye pain    ED Discharge Orders    None       Cristina GongHammond, Roda Lauture W, PA-C 02/25/18 1757    Rolan BuccoBelfi, Melanie, MD 02/25/18 2302

## 2018-02-25 NOTE — Discharge Instructions (Addendum)
Please use your eye drops every 2 hours in the left eye while awake for 7 days.  Please follow up with the eye doctor.    Please take Ibuprofen (Advil, motrin) and Tylenol (acetaminophen) to relieve your pain.  You may take up to 600 MG (3 pills) of normal strength ibuprofen every 8 hours as needed.  In between doses of ibuprofen you make take tylenol, up to 1,000 mg (two extra strength pills).  Do not take more than 3,000 mg tylenol in a 24 hour period.  Please check all medication labels as many medications such as pain and cold medications may contain tylenol.  Do not drink alcohol while taking these medications.  Do not take other NSAID'S while taking ibuprofen (such as aleve or naproxen).  Please take ibuprofen with food to decrease stomach upset.

## 2018-02-25 NOTE — ED Triage Notes (Signed)
Pt states she has a glitter flake in her eye.L eye is swollen and painful.

## 2018-03-26 IMAGING — US US ABDOMEN LIMITED
1 series · 14 of 25 positions shown · non-contrast
Comparison: CT abdomen pelvis March 12, 2005

CLINICAL DATA: Right upper quadrant pain

EXAM:
US ABDOMEN LIMITED - RIGHT UPPER QUADRANT

[Series 1: us abdomen limited · 0.15mm/px · 14 of 45 slices shown]
[im 1/45]
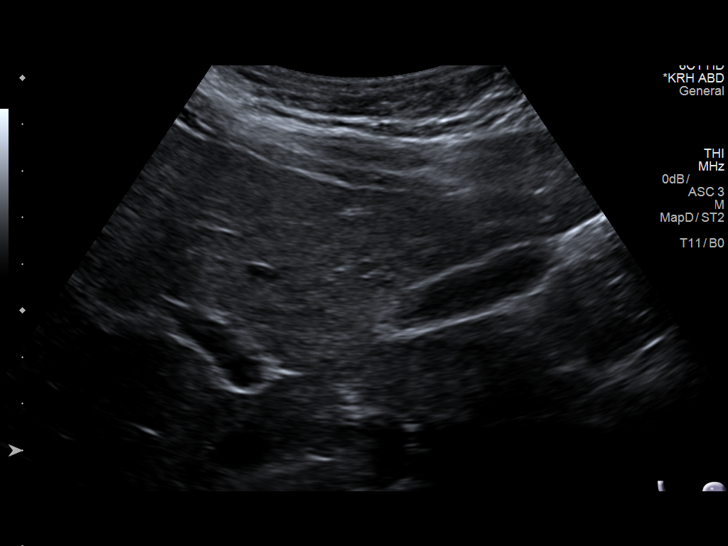
[im 4/45]
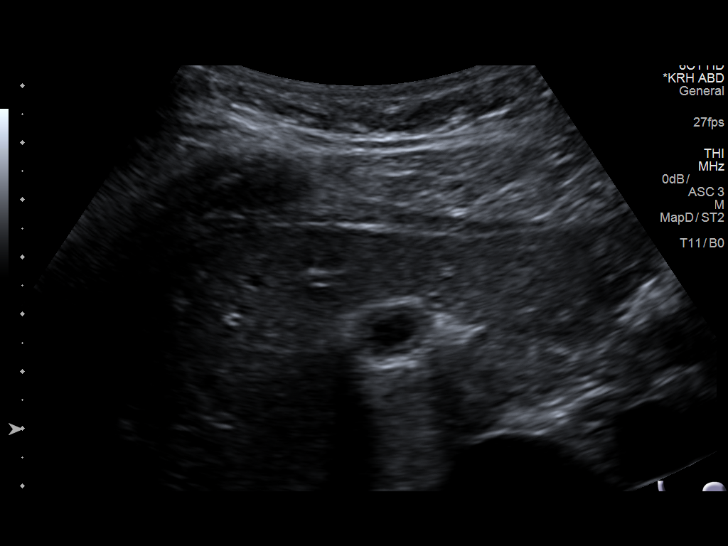
[im 8/45]
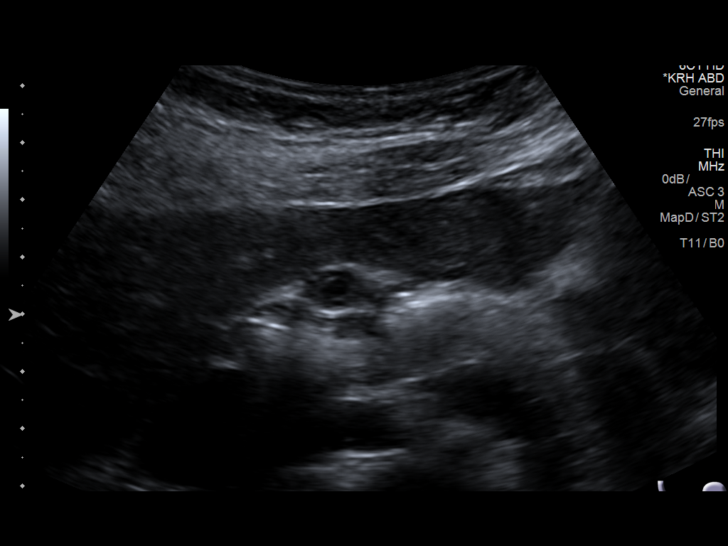
[im 12/45]
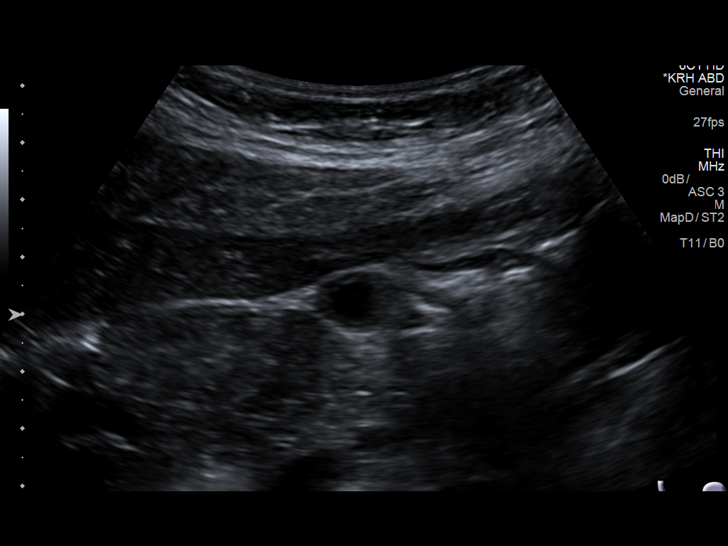
[im 15/45]
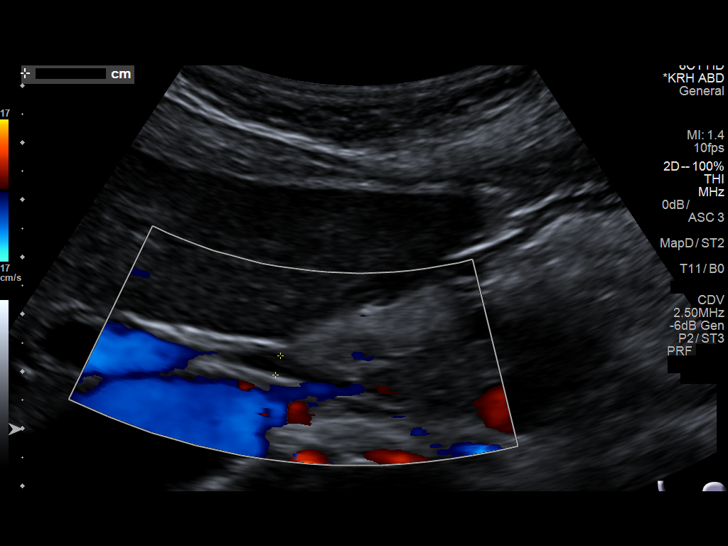
[im 17/45]
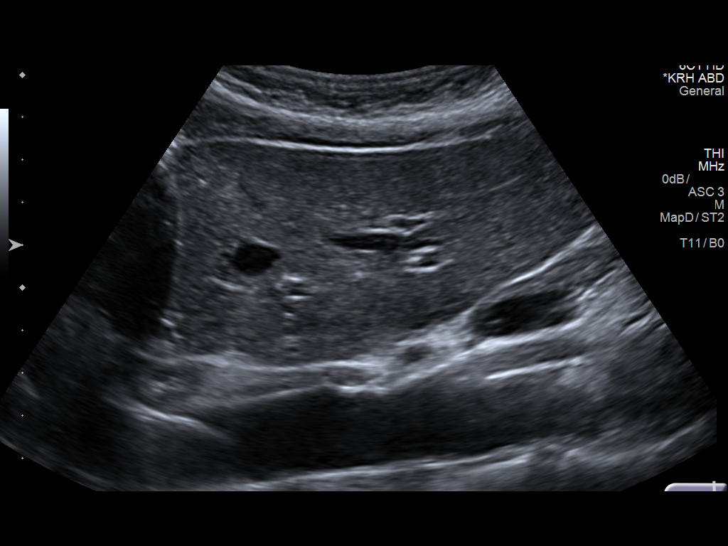
[im 21/45]
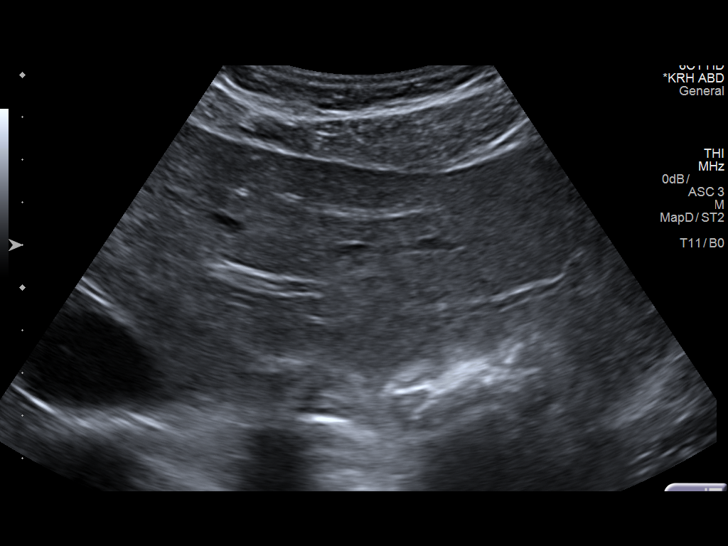
[im 24/45]
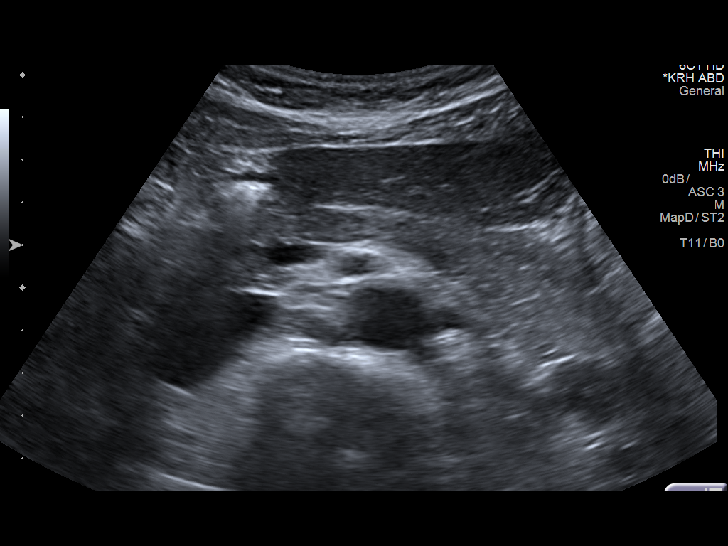
[im 28/45]
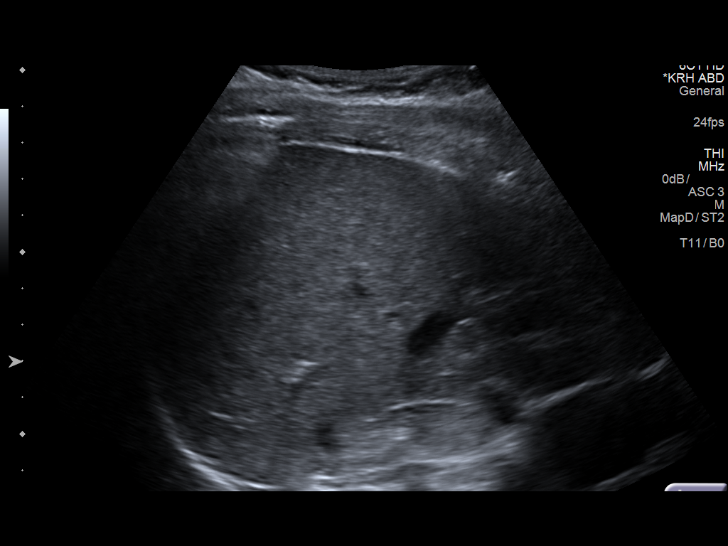
[im 30/45]
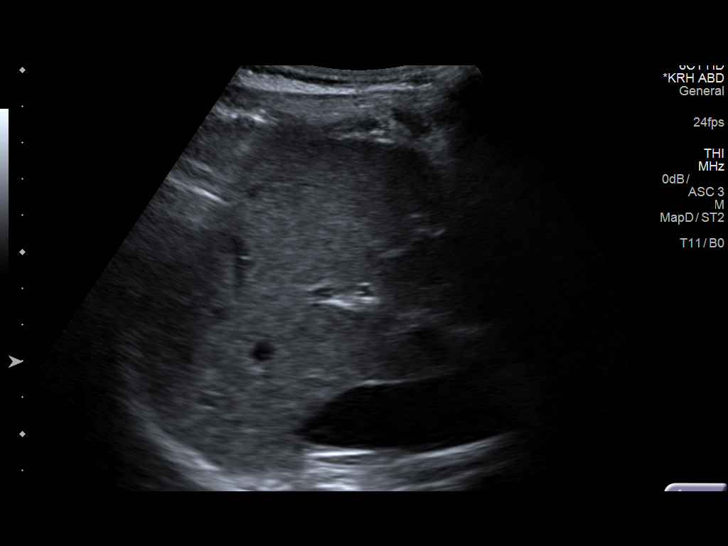
[im 34/45]
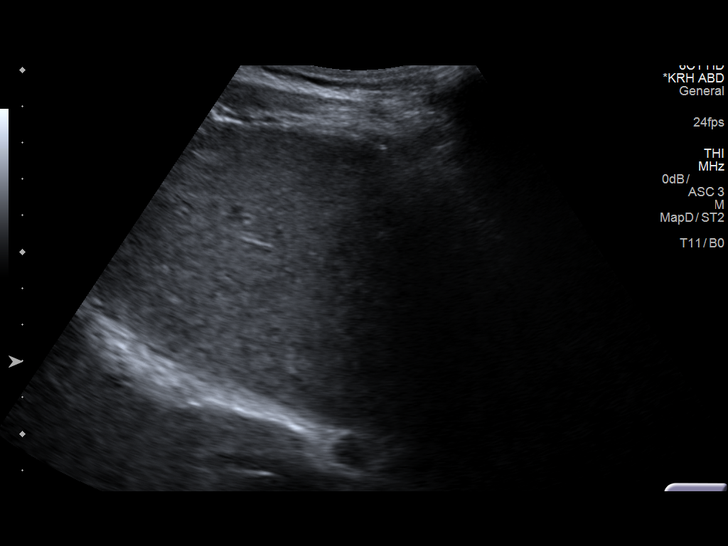
[im 37/45]
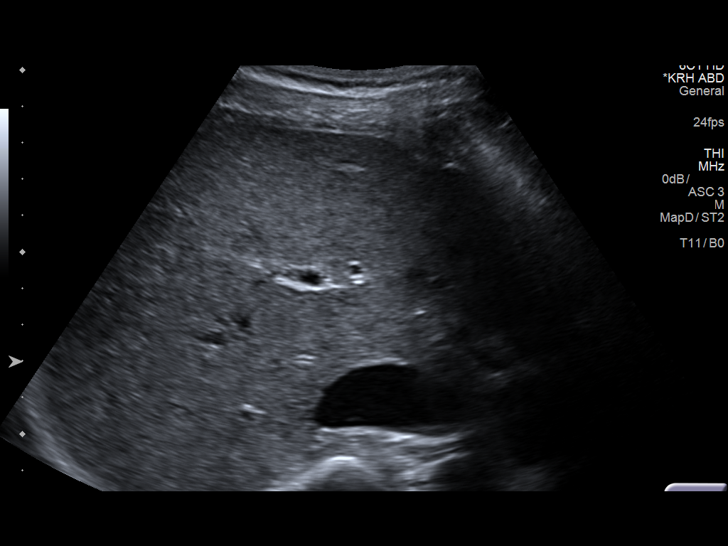
[im 41/45]
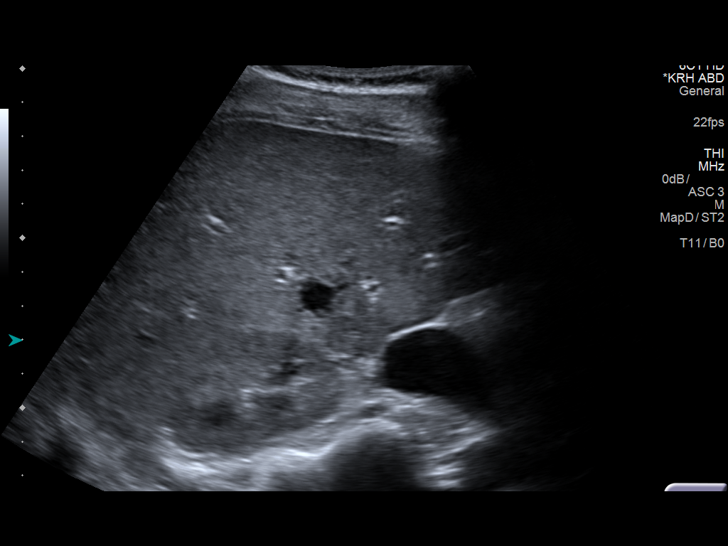
[im 45/45]
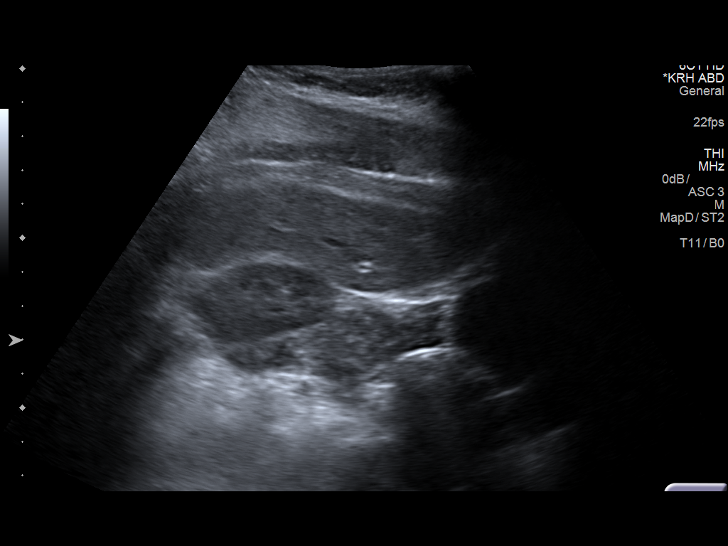

[14 of 25 positions shown; findings below may reference images not displayed]

FINDINGS: Gallbladder:

Gallbladder is contracted consistent with post-prandial state. No
gallstones are evident. There is no pericholecystic fluid. No
sonographic Murphy sign noted by sonographer.

Common bile duct:

Diameter: 4 mm. No intrahepatic or extrahepatic biliary duct
dilatation.

Liver:

No focal lesion identified. Within normal limits in parenchymal
echogenicity.
IMPRESSION: Gallbladder is contracted consistent with post-prandial state. No
gallstones or pericholecystic fluid evident. Given the degree of
gallbladder contraction, small gallstones could be obscured. If
there remains concern for gallbladder pathology, repeat study after
minimum of 8 hours fasting would be advisable. Study otherwise
unremarkable.

## 2019-05-28 NOTE — Progress Notes (Signed)
Cardiology Office Note   Date:  05/30/2019   ID:  Gwendolyn MasterJessica Carll, DOB 06/04/1976, MRN [id number]  PCP:  No primary care provider on file.  Cardiologist:   Rollene RotundaJames Izaia Say, MD Referring:  Self  Chief Complaint  Patient presents with  . Palpitations      History of Present Illness: Gwendolyn Padilla is a 43 y.o. female who presents for evaluation of bradycardia.  I saw her in 2013.  She had palpitations.  Holter demonstrated rare PVCs.   She had a normal echo.  She said he has been getting episodes of her heart beating well.  In April she had an episode of feeling faint.  She felt tingly.  She thought her heart rate was increasing.  She had taken a shot of alcohol.  She started to hyperventilate a little bit.  She went outside to calm her self down.  Since that time she has noticed wearing her Fitbit that her heart rate is often in the 50s.  Sometimes it goes into the high 40s.  However, she does interval walking and jogging and she does not get any cardiovascular symptoms with this.  She does not have any presyncope or syncope.  She otherwise feels fine.  Her heart rate goes up appropriately and comes down when she stops exercising.  She has no chest pressure, neck or arm discomfort.  She has no new shortness of breath, PND or orthopnea.  He had no weight gain or edema.   Past Medical History:  Diagnosis Date  . Ectopic pregnancy, tubal   . Palpitation   . Racing heart beat    avoids caffine/chocolate    Past Surgical History:  Procedure Laterality Date  . ECTOPIC PREGNANCY SURGERY     2  . FOOT SURGERY     Left     Current Outpatient Medications  Medication Sig Dispense Refill  . Multiple Vitamin (MULTI-VITAMINS) TABS Take by mouth.    . norethindrone-ethinyl estradiol (MICROGESTIN,JUNEL,LOESTRIN) 1-20 MG-MCG tablet Take by mouth.     No current facility-administered medications for this visit.     Allergies:   Vicodin [hydrocodone-acetaminophen]    Social History:  The  patient  reports that she has quit smoking. She has never used smokeless tobacco. She reports current alcohol use. She reports that she does not use drugs.   Family History:  The patient's family history includes CAD (age of onset: 10650) in her father; Heart disease (age of onset: 440) in her mother; Hypercholesterolemia in her brother and brother; Hypertension in her sister.    ROS:  Please see the history of present illness.   Otherwise, review of systems are positive for none.   All other systems are reviewed and negative.    PHYSICAL EXAM: VS:  BP 136/74   Pulse (!) 59   Temp (!) 97.1 F (36.2 C)   Ht 5\' 6"  (1.676 m)   Wt 169 lb 9.6 oz (76.9 kg)   SpO2 98%   BMI 27.37 kg/m  , BMI Body mass index is 27.37 kg/m. GENERAL:  Well appearing HEENT:  Pupils equal round and reactive, fundi not visualized, oral mucosa unremarkable NECK:  No jugular venous distention, waveform within normal limits, carotid upstroke brisk and symmetric, no bruits, no thyromegaly LYMPHATICS:  No cervical, inguinal adenopathy LUNGS:  Clear to auscultation bilaterally BACK:  No CVA tenderness CHEST:  Unremarkable HEART:  PMI not displaced or sustained,S1 and S2 within normal limits, no S3, no S4, no clicks, no  rubs, no murmurs ABD:  Flat, positive bowel sounds normal in frequency in pitch, no bruits, no rebound, no guarding, no midline pulsatile mass, no hepatomegaly, no splenomegaly EXT:  2 plus pulses throughout, no edema, no cyanosis no clubbing SKIN:  No rashes no nodules NEURO:  Cranial nerves II through XII grossly intact, motor grossly intact throughout PSYCH:  Cognitively intact, oriented to person place and time    EKG:  EKG is ordered today. The ekg ordered today demonstrates sinus rhythm, rate 88, axis within normal limits, intervals within normal limits, nonspecific repolarization changes, no significant change from previous.   Recent Labs: No results found for requested labs within last 8760  hours.    Lipid Panel No results found for: CHOL, TRIG, HDL, CHOLHDL, VLDL, LDLCALC, LDLDIRECT    Wt Readings from Last 3 Encounters:  05/30/19 169 lb 9.6 oz (76.9 kg)  02/25/18 158 lb (71.7 kg)  04/04/17 152 lb (68.9 kg)      Other studies Reviewed: Additional studies/ records that were reviewed today include: Labs. Review of the above records demonstrates:  Please see elsewhere in the note.     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  She does not notice presyncope or syncope.  Her heart rate goes up appropriately with activity.  She feels well otherwise.  She exercises routinely which might explain her lower heart rate.  Given the absence of associated symptoms I do not think further evaluation is warranted and she was comforted by this.  She will let me know if she has any symptoms in the future.   Current medicines are reviewed at length with the patient today.  The patient does not have concerns regarding medicines.  The following changes have been made:  no change  Labs/ tests ordered today include:   Orders Placed This Encounter  Procedures  . EKG 12-Lead     Disposition:   FU with me as needed.      Signed, Minus Breeding, MD  05/30/2019 5:42 PM    [facility]

## 2019-05-30 ENCOUNTER — Other Ambulatory Visit: Payer: Self-pay

## 2019-05-30 ENCOUNTER — Ambulatory Visit (INDEPENDENT_AMBULATORY_CARE_PROVIDER_SITE_OTHER): Payer: BLUE CROSS/BLUE SHIELD | Admitting: Cardiology

## 2019-05-30 VITALS — BP 136/74 | HR 59 | Temp 97.1°F | Ht 66.0 in | Wt 169.6 lb

## 2019-05-30 DIAGNOSIS — R001 Bradycardia, unspecified: Secondary | ICD-10-CM

## 2019-05-30 DIAGNOSIS — R002 Palpitations: Secondary | ICD-10-CM

## 2019-05-30 NOTE — Patient Instructions (Signed)
Medication Instructions:  NO CHANGE If you need a refill on your cardiac medications before your next appointment, please call your pharmacy.   Lab work: If you have labs (blood work) drawn today and your tests are completely normal, you will receive your results only by: . MyChart Message (if you have MyChart) OR . A paper copy in the mail If you have any lab test that is abnormal or we need to change your treatment, we will call you to review the results.  Follow-Up: At [facility], you and your health needs are our priority.  As part of our continuing mission to provide you with exceptional heart care, we have created designated Provider Care Teams.  These Care Teams include your primary Cardiologist (physician) and Advanced Practice Providers (APPs -  Physician Assistants and Nurse Practitioners) who all work together to provide you with the care you need, when you need it. Your physician recommends that you schedule a follow-up appointment in: AS NEEDED     

## 2019-07-09 ENCOUNTER — Emergency Department (HOSPITAL_BASED_OUTPATIENT_CLINIC_OR_DEPARTMENT_OTHER)
Admission: EM | Admit: 2019-07-09 | Discharge: 2019-07-09 | Disposition: A | Discharge status: Home / Self Care | Payer: Self-pay | Attending: Emergency Medicine | Admitting: Emergency Medicine

## 2019-07-09 ENCOUNTER — Encounter (HOSPITAL_BASED_OUTPATIENT_CLINIC_OR_DEPARTMENT_OTHER): Payer: Self-pay | Admitting: *Deleted

## 2019-07-09 ENCOUNTER — Other Ambulatory Visit: Payer: Self-pay

## 2019-07-09 DIAGNOSIS — R1013 Epigastric pain: Secondary | ICD-10-CM | POA: Insufficient documentation

## 2019-07-09 DIAGNOSIS — Z87891 Personal history of nicotine dependence: Secondary | ICD-10-CM | POA: Insufficient documentation

## 2019-07-09 LAB — COMPREHENSIVE METABOLIC PANEL
ALT: 11 U/L (ref 0–44)
AST: 20 U/L (ref 15–41)
Albumin: 4 g/dL (ref 3.5–5.0)
Alkaline Phosphatase: 52 U/L (ref 38–126)
Anion gap: 9 (ref 5–15)
BUN: 18 mg/dL (ref 6–20)
CO2: 25 mmol/L (ref 22–32)
Calcium: 9.1 mg/dL (ref 8.9–10.3)
Chloride: 105 mmol/L (ref 98–111)
Creatinine, Ser: 1.16 mg/dL — ABNORMAL HIGH (ref 0.44–1.00)
GFR calc Af Amer: >60 mL/min (ref >60)
GFR calc non Af Amer: 59 mL/min — ABNORMAL LOW (ref >60)
Glucose, Bld: 90 mg/dL (ref 70–99)
Potassium: 3.7 mmol/L (ref 3.5–5.1)
Sodium: 139 mmol/L (ref 135–145)
Total Bilirubin: 0.5 mg/dL (ref 0.3–1.2)
Total Protein: 6.7 g/dL (ref 6.5–8.1)

## 2019-07-09 LAB — CBC WITH DIFFERENTIAL/PLATELET
Abs Immature Granulocytes: 0.01 10*3/uL (ref 0.00–0.07)
Basophils Absolute: 0 10*3/uL (ref 0.0–0.1)
Basophils Relative: 0 %
Eosinophils Absolute: 0 10*3/uL (ref 0.0–0.5)
Eosinophils Relative: 1 %
HCT: 37.5 % (ref 36.0–46.0)
Hemoglobin: 11.7 g/dL — ABNORMAL LOW (ref 12.0–15.0)
Immature Granulocytes: 0 %
Lymphocytes Relative: 29 %
Lymphs Abs: 1.5 10*3/uL (ref 0.7–4.0)
MCH: 27.6 pg (ref 26.0–34.0)
MCHC: 31.2 g/dL (ref 30.0–36.0)
MCV: 88.4 fL (ref 80.0–100.0)
Monocytes Absolute: 0.5 10*3/uL (ref 0.1–1.0)
Monocytes Relative: 9 %
Neutro Abs: 3.2 10*3/uL (ref 1.7–7.7)
Neutrophils Relative %: 61 %
Platelets: 220 10*3/uL (ref 150–400)
RBC: 4.24 MIL/uL (ref 3.87–5.11)
RDW: 13.2 % (ref 11.5–15.5)
WBC: 5.3 10*3/uL (ref 4.0–10.5)
nRBC: 0 % (ref 0.0–0.2)

## 2019-07-09 LAB — URINALYSIS, ROUTINE W REFLEX MICROSCOPIC
Bilirubin Urine: NEGATIVE
Glucose, UA: NEGATIVE mg/dL
Ketones, ur: NEGATIVE mg/dL
Leukocytes,Ua: NEGATIVE
Nitrite: NEGATIVE
Protein, ur: NEGATIVE mg/dL
Specific Gravity, Urine: >1.03 — ABNORMAL HIGH (ref 1.005–1.030)
pH: 6 (ref 5.0–8.0)

## 2019-07-09 LAB — URINALYSIS, MICROSCOPIC (REFLEX): WBC, UA: NONE SEEN WBC/hpf (ref 0–5)

## 2019-07-09 LAB — LIPASE, BLOOD: Lipase: 21 U/L (ref 11–51)

## 2019-07-09 LAB — PREGNANCY, URINE: Preg Test, Ur: NEGATIVE

## 2019-07-09 MED ORDER — ALUM & MAG HYDROXIDE-SIMETH 200-200-20 MG/5ML PO SUSP
30.0000 mL | Freq: Once | ORAL | Status: AC
  Administered 2019-07-09: 30 mL via ORAL
  Filled 2019-07-09: qty 30

## 2019-07-09 NOTE — ED Triage Notes (Signed)
Pt c/o  Right upper abd pain " off and on " x 2 weeks denies n/v              '

## 2019-07-09 NOTE — ED Notes (Signed)
ED Provider at bedside. 

## 2019-07-09 NOTE — ED Provider Notes (Signed)
[facility] EMERGENCY DEPARTMENT Provider Note   CSN: [id number] Arrival date & time: 07/09/19  1805     History   Chief Complaint Chief Complaint  Patient presents with  . Abdominal Pain    HPI Gwendolyn Padilla is a 43 y.o. female.     43 yo F with a chief complaints of epigastric abdominal pain.  Going on for the couple weeks.  Occurs off and on.  Worse with fatty foods.  Nothing else seems to make it better or worse.  States it can be worse with alcohol use.  No nausea vomiting or diarrhea.  No fevers.  The history is provided by the patient.  Abdominal Pain Pain location:  Epigastric Pain quality: aching and cramping   Pain radiates to:  Does not radiate Pain severity:  Moderate Onset quality:  Gradual Duration:  2 weeks Timing:  Intermittent Progression:  Waxing and waning Chronicity:  New Relieved by:  Nothing Worsened by:  Nothing Ineffective treatments:  None tried Associated symptoms: no chest pain, no chills, no dysuria, no fever, no nausea, no shortness of breath and no vomiting     Past Medical History:  Diagnosis Date  . Ectopic pregnancy, tubal   . Palpitation   . Racing heart beat    avoids caffine/chocolate    Patient Active Problem List   Diagnosis Date Noted  . Bradycardia 05/30/2019  . Low back pain 02/02/2015  . Palpitations 08/21/2012    Past Surgical History:  Procedure Laterality Date  . ECTOPIC PREGNANCY SURGERY     2  . FOOT SURGERY     Left     OB History    Gravida  0   Para  0   Term  0   Preterm  0   AB  0   Living        SAB  0   TAB  0   Ectopic  0   Multiple      Live Births               Home Medications    Prior to Admission medications   Medication Sig Start Date End Date Taking? Authorizing Provider  Multiple Vitamin (MULTI-VITAMINS) TABS Take by mouth.    [provider]  norethindrone-ethinyl estradiol (MICROGESTIN,JUNEL,LOESTRIN) 1-20 MG-MCG tablet Take by  mouth. 11/25/16   [provider]    Family History Family History  Problem Relation Age of Onset  . CAD Father 18  . Heart disease Mother 51       Palpitations and murmur  . Hypertension Sister   . Hypercholesterolemia Brother   . Hypercholesterolemia Brother     Social History Social History   Tobacco Use  . Smoking status: Former Games developer  . Smokeless tobacco: Never Used  Substance Use Topics  . Alcohol use: Yes  . Drug use: No     Allergies   Vicodin [hydrocodone-acetaminophen]   Review of Systems Review of Systems  Constitutional: Negative for chills and fever.  HENT: Negative for congestion and rhinorrhea.   Eyes: Negative for redness and visual disturbance.  Respiratory: Negative for shortness of breath and wheezing.   Cardiovascular: Negative for chest pain and palpitations.  Gastrointestinal: Positive for abdominal pain. Negative for nausea and vomiting.  Genitourinary: Negative for dysuria and urgency.  Musculoskeletal: Negative for arthralgias and myalgias.  Skin: Negative for pallor and wound.  Neurological: Negative for dizziness and headaches.     Physical Exam Updated Vital Signs  BP 124/76   Pulse 84   Temp 98.9 F (37.2 C)   Resp 17   Ht 5\' 5"  (1.651 m)   Wt 74.8 kg   LMP 07/09/2019   SpO2 99%   BMI 27.46 kg/m   Physical Exam Vitals signs and nursing note reviewed.  Constitutional:      General: She is not in acute distress.    Appearance: She is well-developed. She is not diaphoretic.  HENT:     Head: Normocephalic and atraumatic.  Eyes:     Pupils: Pupils are equal, round, and reactive to light.  Neck:     Musculoskeletal: Normal range of motion and neck supple.  Cardiovascular:     Rate and Rhythm: Normal rate and regular rhythm.     Heart sounds: No murmur. No friction rub. No gallop.   Pulmonary:     Effort: Pulmonary effort is normal.     Breath sounds: No wheezing or rales.  Abdominal:     General: There is no  distension.     Palpations: Abdomen is soft.     Tenderness: There is no abdominal tenderness.     Comments: No appreciable tenderness, negative murphys sign.   Musculoskeletal:        General: No tenderness.  Skin:    General: Skin is warm and dry.  Neurological:     Mental Status: She is alert and oriented to person, place, and time.  Psychiatric:        Behavior: Behavior normal.      ED Treatments / Results  Labs (all labs ordered are listed, but only abnormal results are displayed) Labs Reviewed  URINALYSIS, ROUTINE W REFLEX MICROSCOPIC - Abnormal; Notable for the following components:      Result Value   Specific Gravity, Urine >1.030 (*)    Hgb urine dipstick LARGE (*)    All other components within normal limits  URINALYSIS, MICROSCOPIC (REFLEX) - Abnormal; Notable for the following components:   Bacteria, UA RARE (*)    All other components within normal limits  CBC WITH DIFFERENTIAL/PLATELET - Abnormal; Notable for the following components:   Hemoglobin 11.7 (*)    All other components within normal limits  COMPREHENSIVE METABOLIC PANEL - Abnormal; Notable for the following components:   Creatinine, Ser 1.16 (*)    GFR calc non Af Amer 59 (*)    All other components within normal limits  PREGNANCY, URINE  LIPASE, BLOOD    EKG None  Radiology No results found.  Procedures Procedures (including critical care time)  Medications Ordered in ED Medications  alum & mag hydroxide-simeth (MAALOX/MYLANTA) 200-200-20 MG/5ML suspension 30 mL (30 mLs Oral Given 07/09/19 2012)     Initial Impression / Assessment and Plan / ED Course  I have reviewed the triage vital signs and the nursing notes.  Pertinent labs & imaging results that were available during my care of the patient were reviewed by me and considered in my medical decision making (see chart for details).        43 yo F with a cc of epigastric abdominal pain.  Going on for the past couple weeks.   Benign abdominal exam.  Will give maalox, lab work.   Labs without lft elevation.  Lipase normal.  No leukocytosis.    D/c home.   8:48 PM:  I have discussed the diagnosis/risks/treatment options with the patient and believe the pt to be eligible for discharge home to follow-up with PCP. We also discussed  returning to the ED immediately if new or worsening sx occur. We discussed the sx which are most concerning (e.g., sudden worsening pain, fever, inability to tolerate by mouth) that necessitate immediate return. Medications administered to the patient during their visit and any new prescriptions provided to the patient are listed below.  Medications given during this visit Medications  alum & mag hydroxide-simeth (MAALOX/MYLANTA) 200-200-20 MG/5ML suspension 30 mL (30 mLs Oral Given 07/09/19 2012)     The patient appears reasonably screen and/or stabilized for discharge and I doubt any other medical condition or other [facility] requiring further screening, evaluation, or treatment in the ED at this time prior to discharge.    Final Clinical Impressions(s) / ED Diagnoses   Final diagnoses:  Epigastric pain    ED Discharge Orders    None       Melene PlanFloyd, Marley Charlot, DO 07/09/19 2048

## 2019-07-09 NOTE — Discharge Instructions (Signed)
Try zantac or pepcid twice a day.  Try to avoid things that may make this worse, most commonly these are spicy foods tomato based products fatty foods chocolate and peppermint.  Alcohol and tobacco can also make this worse.  Return to the emergency department for sudden worsening pain fever or inability to eat or drink. ° °

## 2019-08-14 ENCOUNTER — Other Ambulatory Visit: Payer: Self-pay

## 2019-08-14 ENCOUNTER — Emergency Department (HOSPITAL_BASED_OUTPATIENT_CLINIC_OR_DEPARTMENT_OTHER)
Admission: EM | Admit: 2019-08-14 | Discharge: 2019-08-14 | Disposition: A | Discharge status: Home / Self Care | Payer: Self-pay | Attending: Emergency Medicine | Admitting: Emergency Medicine

## 2019-08-14 ENCOUNTER — Emergency Department (HOSPITAL_BASED_OUTPATIENT_CLINIC_OR_DEPARTMENT_OTHER): Payer: Self-pay

## 2019-08-14 ENCOUNTER — Encounter (HOSPITAL_BASED_OUTPATIENT_CLINIC_OR_DEPARTMENT_OTHER): Payer: Self-pay | Admitting: Emergency Medicine

## 2019-08-14 DIAGNOSIS — Z79899 Other long term (current) drug therapy: Secondary | ICD-10-CM | POA: Insufficient documentation

## 2019-08-14 DIAGNOSIS — Z8616 Personal history of COVID-19: Secondary | ICD-10-CM

## 2019-08-14 DIAGNOSIS — Z885 Allergy status to narcotic agent status: Secondary | ICD-10-CM | POA: Insufficient documentation

## 2019-08-14 DIAGNOSIS — B349 Viral infection, unspecified: Secondary | ICD-10-CM

## 2019-08-14 DIAGNOSIS — Z8619 Personal history of other infectious and parasitic diseases: Secondary | ICD-10-CM

## 2019-08-14 DIAGNOSIS — J9801 Acute bronchospasm: Secondary | ICD-10-CM | POA: Insufficient documentation

## 2019-08-14 MED ORDER — ALBUTEROL SULFATE HFA 108 (90 BASE) MCG/ACT IN AERS
2.0000 | INHALATION_SPRAY | RESPIRATORY_TRACT | Status: DC | PRN
  Administered 2019-08-14: 2 via RESPIRATORY_TRACT
  Filled 2019-08-14: qty 6.7

## 2019-08-14 MED ORDER — DEXAMETHASONE SODIUM PHOSPHATE 10 MG/ML IJ SOLN
10.0000 mg | Freq: Once | INTRAMUSCULAR | Status: AC
  Administered 2019-08-14: 02:00:00 10 mg via INTRAMUSCULAR
  Filled 2019-08-14: qty 1

## 2019-08-14 NOTE — ED Provider Notes (Signed)
[place] DEPT [facility] Provider Note: Georgena Spurling, MD, FACEP  CSN: [id number] MRN: [id number] ARRIVAL: 08/14/19 at [place]: [place]  08/14/19 12:53 AM Janett Billow Rosebud Koenen is a 43 y.o. female who had Covid at the end of October 2020.  She had recovered from her Covid symptoms but for the past 3 to 4 days has had shortness of breath and a sense of chest and throat tightness.  Symptoms are moderate.  They are worse at night and when she gets hot.  She does not have an inhaler and has never used one.  She has a history of GERD but is on omeprazole.  She has been using a humidifier and drinking hot liquids and cool liquids without relief.  She has not had a new fever.  She has had some productive cough.   Past Medical History:  Diagnosis Date  . Ectopic pregnancy, tubal   . Palpitation   . Racing heart beat    avoids caffine/chocolate    Past Surgical History:  Procedure Laterality Date  . ECTOPIC PREGNANCY SURGERY     2  . FOOT SURGERY     Left    Family History  Problem Relation Age of Onset  . CAD Father 12  . Heart disease Mother 59       Palpitations and murmur  . Hypertension Sister   . Hypercholesterolemia Brother   . Hypercholesterolemia Brother     Social History   Tobacco Use  . Smoking status: Former Research scientist (life sciences)  . Smokeless tobacco: Never Used  Substance Use Topics  . Alcohol use: Yes  . Drug use: No    Prior to Admission medications   Medication Sig Start Date End Date Taking? Authorizing Provider  Multiple Vitamin (MULTI-VITAMINS) TABS Take by mouth.    [provider]  norethindrone-ethinyl estradiol (MICROGESTIN,JUNEL,LOESTRIN) 1-20 MG-MCG tablet Take by mouth. 11/25/16   [provider]    Allergies Vicodin [hydrocodone-acetaminophen]   REVIEW OF SYSTEMS  Negative except as noted here or in the History of Present Illness.   PHYSICAL EXAMINATION   Initial Vital Signs Blood pressure (!) 149/84, pulse 66, temperature 98.2 F (36.8 C), temperature source Oral, resp. rate 18, height 5\' 5"  (1.651 m), weight 74.8 kg, last menstrual period 08/05/2019, SpO2 100 %.  Examination General: Well-developed, well-nourished female in no acute distress; appearance consistent with age of record HENT: normocephalic; atraumatic Eyes: pupils equal, round and reactive to light; extraocular muscles intact Neck: supple Heart: regular rate and rhythm Lungs: Decreased air movement bilaterally without wheezing Abdomen: soft; nondistended; nontender; bowel sounds present Extremities: No deformity; full range of motion; pulses normal Neurologic: Awake, alert and oriented; motor function intact in all extremities and symmetric; no facial droop Skin: Warm and dry Psychiatric: Normal mood and affect   RESULTS  Summary of this visit's results, reviewed and interpreted by myself:   EKG Interpretation  Date/Time:    Ventricular Rate:    PR Interval:    QRS Duration:   QT Interval:    QTC Calculation:   R Axis:     Text Interpretation:        Laboratory Studies: No results found for this or any previous visit (from the past 24 hour(s)). Imaging Studies: Cxr Pa/lat  Result Date: 08/14/2019 CLINICAL DATA:  Shortness of breath EXAM: CHEST - 2 VIEW COMPARISON:  10/13/2015 FINDINGS: Heart and mediastinal contours are within normal limits. No  focal opacities or effusions. No acute bony abnormality. IMPRESSION: No active cardiopulmonary disease. Electronically Signed   By: Charlett Nose M.D.   On: 08/14/2019 01:27    ED COURSE and MDM  Nursing notes, initial and subsequent vitals signs, including pulse oximetry, reviewed and interpreted by myself.  Vitals:   08/14/19 0046 08/14/19 0050 08/14/19 0124  BP:  (!) 149/84   Pulse:  66   Resp:  18   Temp:  98.2 F (36.8 C)   TempSrc:  Oral   SpO2:  100% 100%  Weight: 74.8 kg    Height: 5\' 5"  (1.651 m)      Medications  albuterol (VENTOLIN HFA) 108 (90 Base) MCG/ACT inhaler 2 puff (2 puffs Inhalation Given 08/14/19 0116)  dexamethasone (DECADRON) injection 10 mg (has no administration in time range)   1:44 AM Air movement improved and patient feels better after albuterol inhaler treatment.  She was given an inhaler and AeroChamber and instructed to use.  We will also give a dose of dexamethasone.  I suspect this is bronchospasm residual from her recent Covid infection.  PROCEDURES  Procedures   ED DIAGNOSES     ICD-10-CM   1. Acute bronchospasm due to viral infection  J98.01    B34.9   2. History of 2019 novel coronavirus disease (COVID-19)  Z86.19        Zandrea Kenealy, MD 08/14/19 0145

## 2019-08-14 NOTE — ED Triage Notes (Signed)
Pt states she feels short of breath and has had some pressure in her chest  Pt states she feels like something is lodged in her throat  Pt states she has been using a humidifier and drinking hot liquids and cool liquids  Pt states nothing is helping  Pt states it is worse at night and worse when she gets hot  Pt was diagnosed with covid the end of October

## 2021-04-07 IMAGING — CR DG CHEST 2V
2 series · 2 of 2 positions shown · non-contrast
Comparison: 10/13/2015

CLINICAL DATA: Shortness of breath

EXAM:
CHEST - 2 VIEW

[w chest pa]
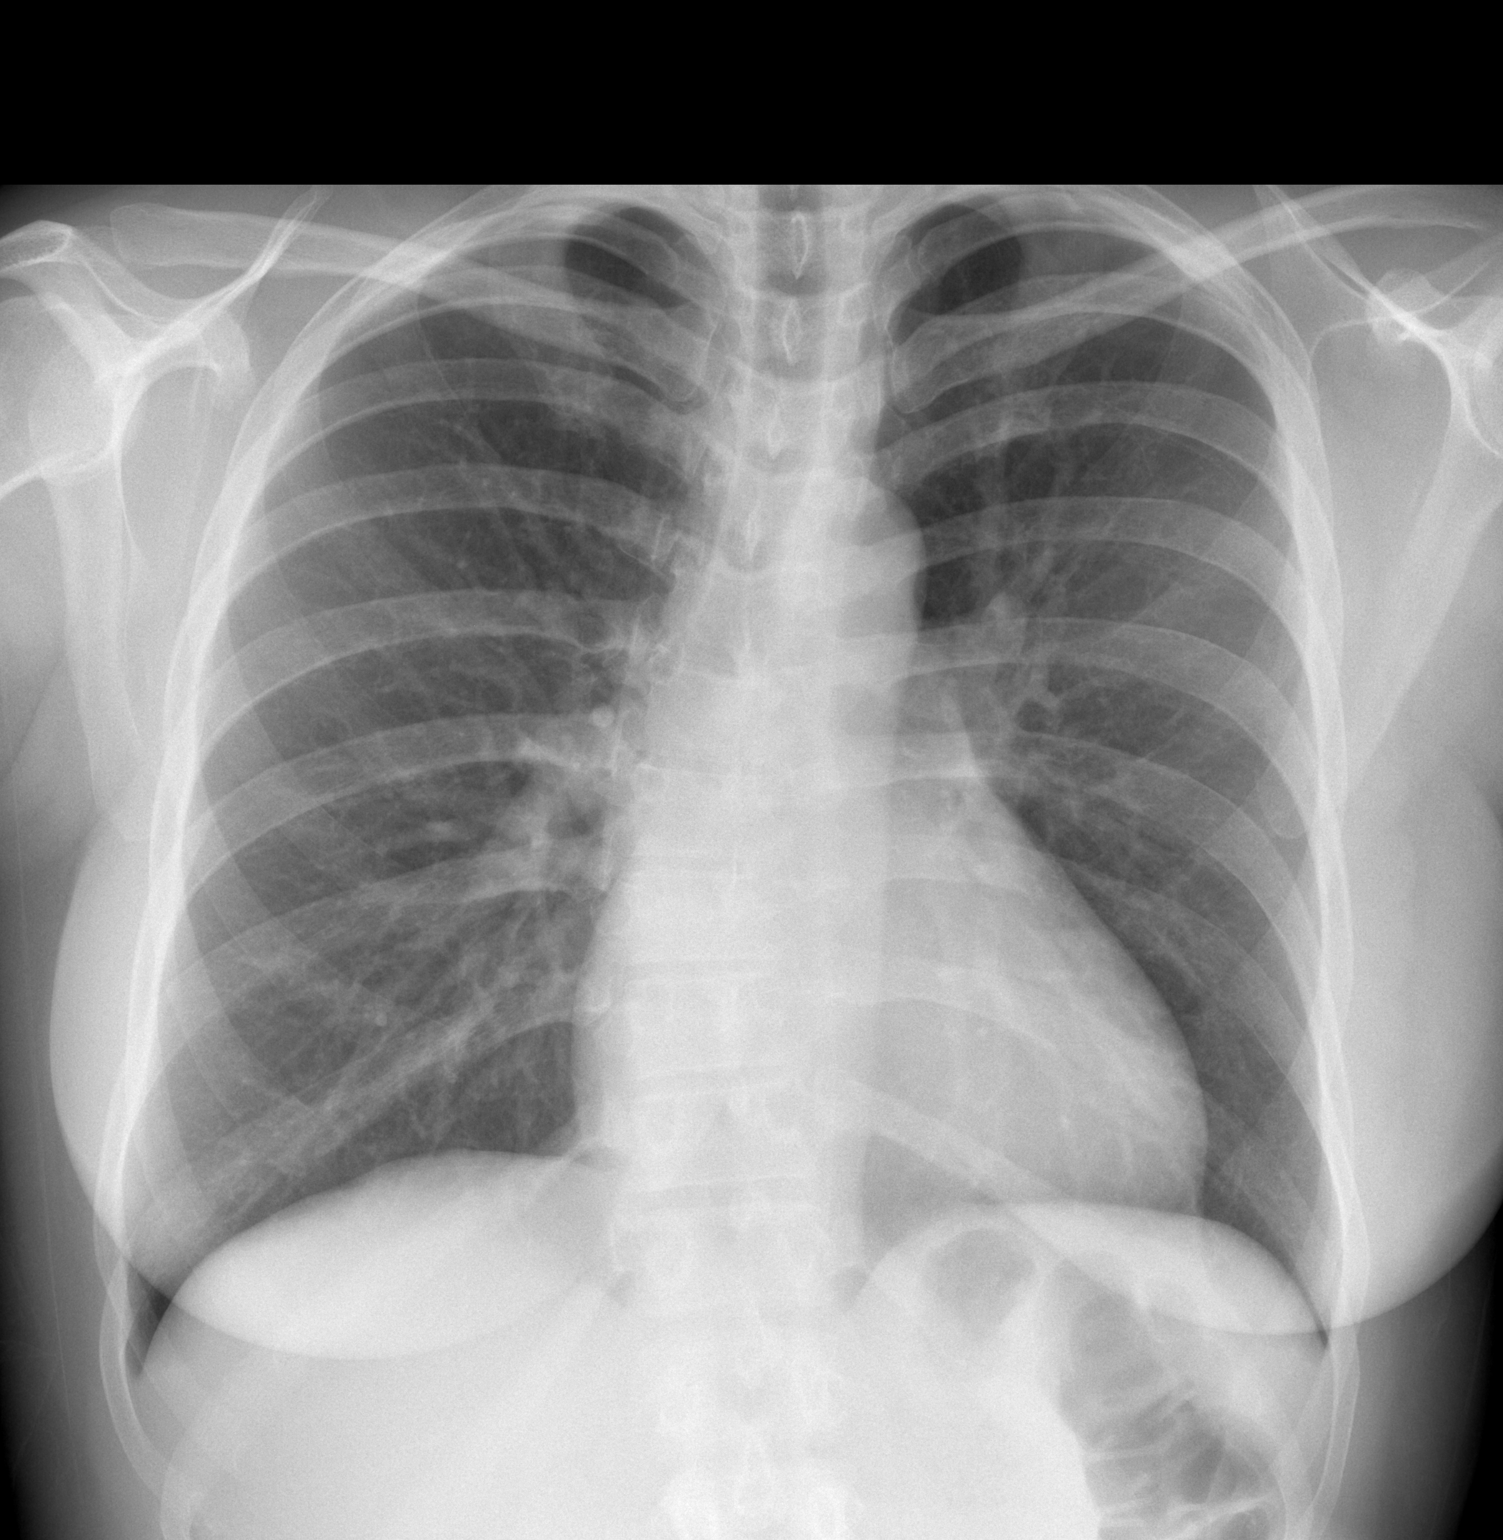

[w chest lat]
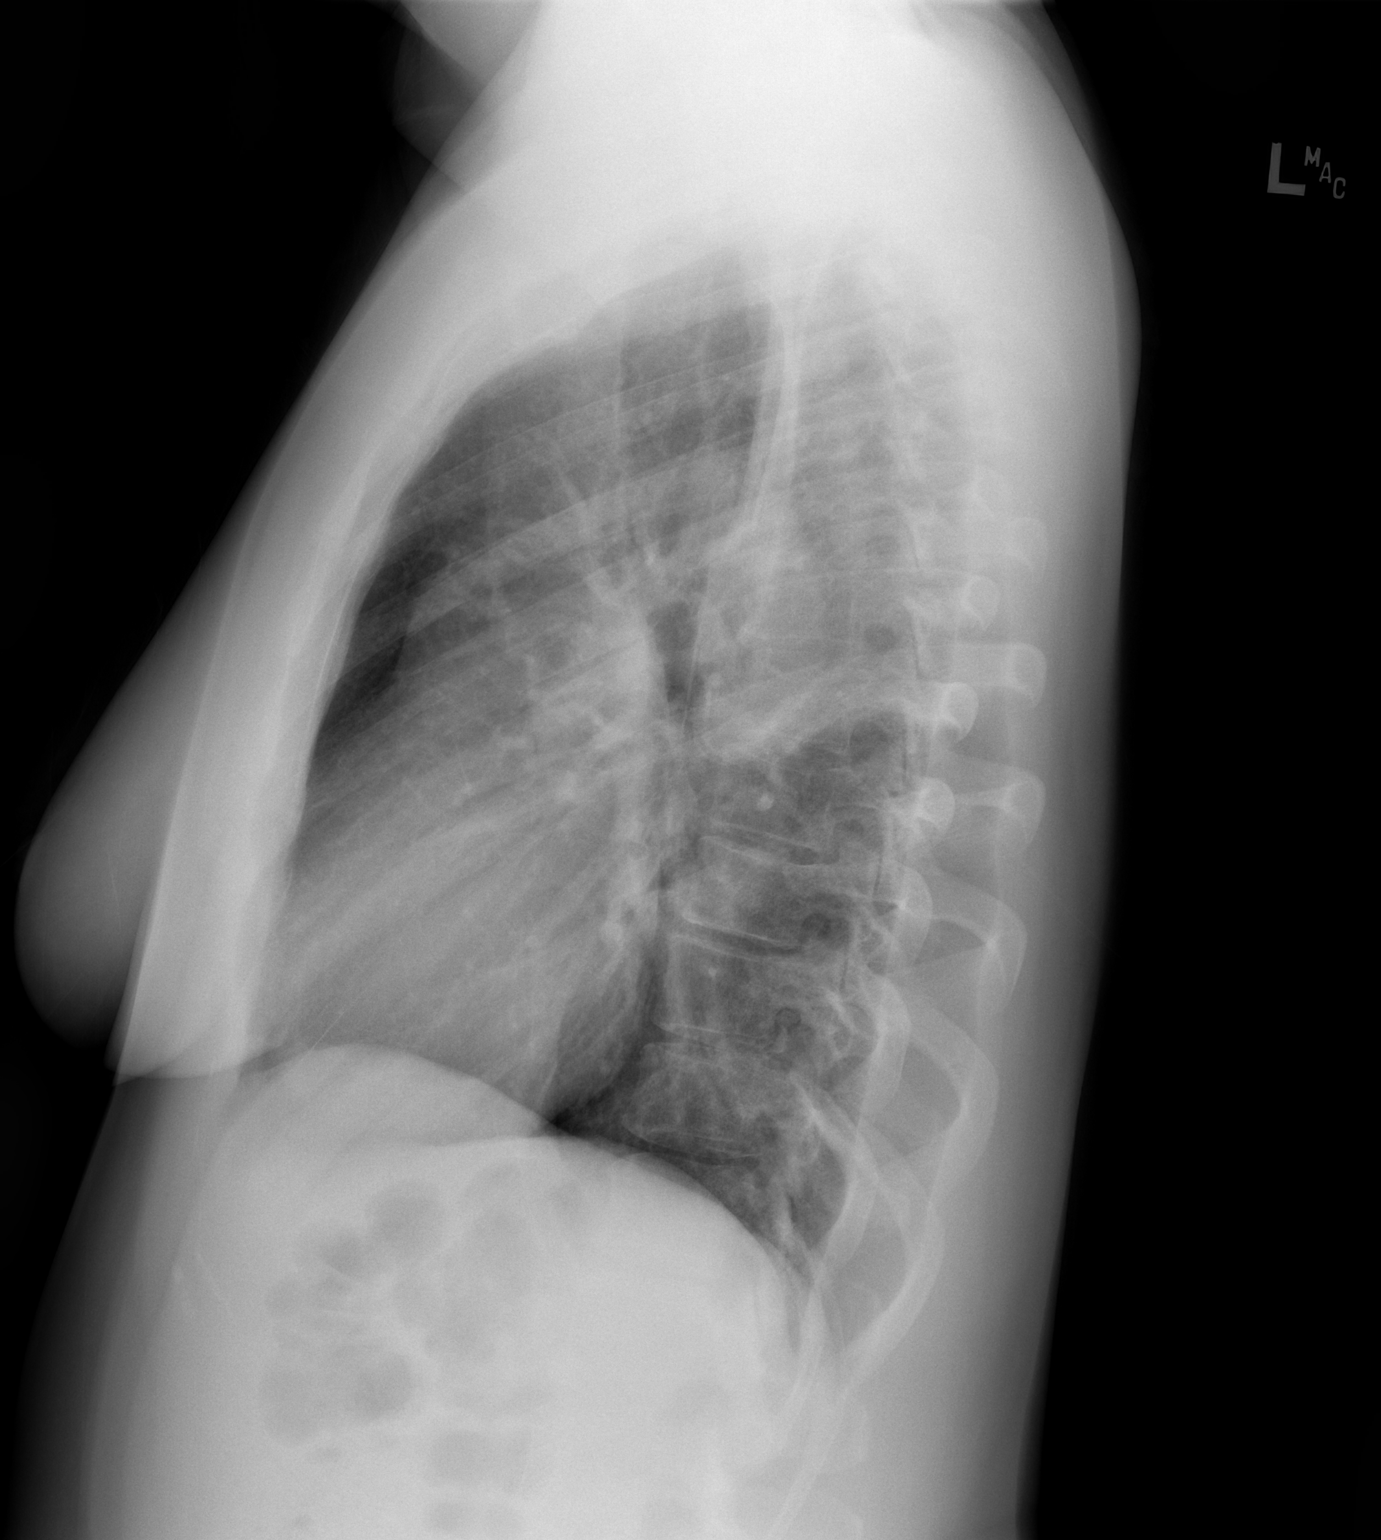

[2 of 2 positions shown; findings below may reference images not displayed]

FINDINGS: Heart and mediastinal contours are within normal limits. No focal
opacities or effusions. No acute bony abnormality.
IMPRESSION: No active cardiopulmonary disease.
